# Patient Record
Sex: Male | Born: 1955 | Race: White | Hispanic: No | Marital: Married | Smoking: Never smoker
Health system: Southern US, Community
[De-identification: ages and names within clinical notes are randomized; demographics above are authoritative.]

## PROBLEM LIST (undated history)

## (undated) DIAGNOSIS — M5126 Other intervertebral disc displacement, lumbar region: Secondary | ICD-10-CM

## (undated) DIAGNOSIS — M5136 Other intervertebral disc degeneration, lumbar region: Secondary | ICD-10-CM

## (undated) DIAGNOSIS — I8390 Asymptomatic varicose veins of unspecified lower extremity: Secondary | ICD-10-CM

## (undated) DIAGNOSIS — T7840XA Allergy, unspecified, initial encounter: Secondary | ICD-10-CM

## (undated) DIAGNOSIS — Z8619 Personal history of other infectious and parasitic diseases: Secondary | ICD-10-CM

## (undated) DIAGNOSIS — K5790 Diverticulosis of intestine, part unspecified, without perforation or abscess without bleeding: Secondary | ICD-10-CM

## (undated) DIAGNOSIS — J302 Other seasonal allergic rhinitis: Secondary | ICD-10-CM

## (undated) DIAGNOSIS — M51369 Other intervertebral disc degeneration, lumbar region without mention of lumbar back pain or lower extremity pain: Secondary | ICD-10-CM

## (undated) DIAGNOSIS — K635 Polyp of colon: Secondary | ICD-10-CM

## (undated) DIAGNOSIS — M1990 Unspecified osteoarthritis, unspecified site: Secondary | ICD-10-CM

## (undated) HISTORY — DX: Personal history of other infectious and parasitic diseases: Z86.19

## (undated) HISTORY — DX: Diverticulosis of intestine, part unspecified, without perforation or abscess without bleeding: K57.90

## (undated) HISTORY — DX: Unspecified osteoarthritis, unspecified site: M19.90

## (undated) HISTORY — DX: Allergy, unspecified, initial encounter: T78.40XA

## (undated) HISTORY — DX: Asymptomatic varicose veins of unspecified lower extremity: I83.90

## (undated) HISTORY — DX: Other intervertebral disc displacement, lumbar region: M51.26

## (undated) HISTORY — DX: Polyp of colon: K63.5

## (undated) HISTORY — DX: Other intervertebral disc degeneration, lumbar region without mention of lumbar back pain or lower extremity pain: M51.369

## (undated) HISTORY — DX: Other intervertebral disc degeneration, lumbar region: M51.36

---

## 1977-08-27 HISTORY — PX: HERNIA REPAIR: SHX51

## 2007-11-17 ENCOUNTER — Ambulatory Visit: Payer: Self-pay | Admitting: Unknown Physician Specialty

## 2015-08-28 DIAGNOSIS — K5790 Diverticulosis of intestine, part unspecified, without perforation or abscess without bleeding: Secondary | ICD-10-CM

## 2015-08-28 DIAGNOSIS — K635 Polyp of colon: Secondary | ICD-10-CM

## 2015-08-28 HISTORY — DX: Polyp of colon: K63.5

## 2015-08-28 HISTORY — DX: Diverticulosis of intestine, part unspecified, without perforation or abscess without bleeding: K57.90

## 2015-08-28 HISTORY — PX: COLONOSCOPY: SHX174

## 2015-10-06 ENCOUNTER — Ambulatory Visit (INDEPENDENT_AMBULATORY_CARE_PROVIDER_SITE_OTHER): Payer: Managed Care, Other (non HMO) | Admitting: Family Medicine

## 2015-10-06 ENCOUNTER — Other Ambulatory Visit (INDEPENDENT_AMBULATORY_CARE_PROVIDER_SITE_OTHER): Payer: Managed Care, Other (non HMO)

## 2015-10-06 ENCOUNTER — Ambulatory Visit (INDEPENDENT_AMBULATORY_CARE_PROVIDER_SITE_OTHER)
Admission: RE | Admit: 2015-10-06 | Discharge: 2015-10-06 | Disposition: A | Discharge status: Home / Self Care | Payer: Managed Care, Other (non HMO) | Source: Ambulatory Visit | Attending: Family Medicine | Admitting: Family Medicine

## 2015-10-06 VITALS — BP 120/80 | HR 81 | Wt 247.0 lb

## 2015-10-06 DIAGNOSIS — M25561 Pain in right knee: Secondary | ICD-10-CM

## 2015-10-06 DIAGNOSIS — M1711 Unilateral primary osteoarthritis, right knee: Secondary | ICD-10-CM | POA: Insufficient documentation

## 2015-10-06 DIAGNOSIS — M129 Arthropathy, unspecified: Secondary | ICD-10-CM | POA: Diagnosis not present

## 2015-10-06 MED ORDER — DICLOFENAC SODIUM 2 % TD SOLN: TRANSDERMAL | Status: DC

## 2015-10-06 NOTE — Assessment & Plan Note (Signed)
I believe the patient has more arthritis of the right knee. Using is mild to moderate. X-rays are pending. No significant bony abnormalities otherwise. Do not see anything other than some degenerative meniscal tearing. No significant swelling today. Patient has elected try conservative therapy. Given home exercises and work with Product/process development scientist. We discussed icing regimen. We discussed topical anti-inflammatories and perception was given. We discussed over-the-counter medications. Patient will try all these changes as well as possible shoe choices. Patient and will come back and see me again in 4 weeks. If having worsening symptoms we'll consider physical therapy as well as potentially injections.

## 2015-10-06 NOTE — Patient Instructions (Signed)
Great to see you  Xray downstairs today  Ice is your friend Ice 20 minutes 2 times daily. Usually after activity and before bed. Exercises 3 times a week.  pennsaid pinkie amount topically 2 times daily as needed.  Spenco orthotics "total support" online would be great  Good shoes with rigid bottom.  Jalene Mullet, Merrell or New balance greater then 700, some of these could be in boots as well.  Avoid direct contact on the knee when you can.  Vitamin D 2000 IU daily Turmeric 500mg  twice daily See me again in 4 weeks to make sure you are making progress.

## 2015-10-06 NOTE — Progress Notes (Signed)
[facility] [place], [name] [address] Phone: [phone] Subjective:    I'm seeing this patient by the request  of:  Angelyn Punt NP  CC: Right knee pain  [id number] Dylan Crawford Sr. is a 60 y.o. male coming in with complaint of right knee pain. States that his been going on years. Has gotten worse over the last multiple months. Son another provider and was sent here for further evaluation. Patient states the pain is more when he is going up and down stairs as well as when he puts pressure on the kneecap itself. Patient is a Dealer and notices quite frequently. States that certain range of motion can give him some uncomfortable feeling. Sometimes has a crepitus motion. Sometimes feel a little unstable. Never has given out on him. No nighttime awakening. No numbness or radiation of the pain. Rates the severity of pain a 6 out of 10. Has responded fairly well to ibuprofen. Patient is more concerned because it does seem to be worsening slowly.     No past medical history on file. seasonal allergies No past surgical history on file. Social History   Social History  . Marital Status: Married    Spouse Name: N/A  . Number of Children: N/A  . Years of Education: N/A   Social History Main Topics  . Smoking status: Not on file  . Smokeless tobacco: Not on file  . Alcohol Use: Not on file  . Drug Use: Not on file  . Sexual Activity: Not on file   Other Topics Concern  . Not on file   Social History Narrative  . No narrative on file   Not on File no known drug allergies No family history on file. no family history of autoimmune diseases.  Past medical history, social, surgical and family history all reviewed in electronic medical record.  No pertanent information unless stated regarding to the chief complaint.   Review of Systems: No headache, visual changes, nausea, vomiting, diarrhea, constipation, dizziness, abdominal pain, skin  rash, fevers, chills, night sweats, weight loss, swollen lymph nodes, body aches, joint swelling, muscle aches, chest pain, shortness of breath, mood changes.   Objective Blood pressure 120/80, pulse 81, weight 247 lb (112.038 kg), SpO2 97 %.  General: No apparent distress alert and oriented x3 mood and affect normal, dressed appropriately.  HEENT: Pupils equal, extraocular movements intact  Respiratory: Patient's speak in full sentences and does not appear short of breath  Cardiovascular: No lower extremity edema, non tender, no erythema  Skin: Warm dry intact with no signs of infection or rash on extremities or on axial skeleton.  Abdomen: Soft nontender  Neuro: Cranial nerves II through XII are intact, neurovascularly intact in all extremities with 2+ DTRs and 2+ pulses.  Lymph: No lymphadenopathy of posterior or anterior cervical chain or axillae bilaterally.  Gait normal with good balance and coordination.  MSK:  Non tender with full range of motion and good stability and symmetric strength and tone of shoulders, elbows, wrist, hip, and ankles bilaterally.  Knee: Right Normal to inspection with no erythema or effusion or obvious bony abnormalities. Mild tenderness to palpation over the medial joint line ROM full in flexion and extension and lower leg rotation. Ligaments with solid consistent endpoints including ACL, PCL, LCL, MCL. Negative Mcmurray's, Apley's, and Thessalonian tests. Mild painful patellar compression. Patellar glide with mild crepitus. Patellar and quadriceps tendons unremarkable. Hamstring and quadriceps strength is normal. Contralateral knee  unremarkable. MSK US performed of: Right knee This study was ordered, performed, and interpreted by Charlann Boxer D.O.  Knee: All structures visualized. Moderate degenerative changes of the medial and lateral meniscus with mild to moderate narrowing of both joint lines. Patellar Tendon unremarkable on long and transverse views  without effusion. No abnormality of prepatellar bursa. LCL and MCL unremarkable on long and transverse views. No abnormality of origin of medial or lateral head of the gastrocnemius. Images saved in internal hard drive  IMPRESSION:  Mild to moderate os threaded changes with degenerative changes of the meniscus.  Procedure note [id number]; 15 minutes spent for Therapeutic exercises as stated in above notes.  This included exercises focusing on stretching, strengthening, with significant focus on eccentric aspects.  Suction and extension exercises given working on the vastus medialis oblique as well as hamstring stretching. Patient also given information for hip abductor strengthening for more stabilization. Proper technique shown and discussed handout in great detail with ATC.  All questions were discussed and answered.     Impression and Recommendations:     This case required medical decision making of moderate complexity.      Note: This dictation was prepared with Dragon dictation along with smaller phrase technology. Any transcriptional errors that result from this process are unintentional.

## 2015-11-02 ENCOUNTER — Telehealth: Payer: Self-pay | Admitting: Family Medicine

## 2015-11-02 NOTE — Telephone Encounter (Signed)
OK to schedule for CPE and to re-establish.

## 2015-11-02 NOTE — Telephone Encounter (Signed)
Pt would like to re-establish and schedule a CPE with Dr. Caryn Section. Pt was last seen in 2011. Pt works with [employer] and was able to go to the clinic they offer for employees for minor issues. Pt stated he has not been to another PCP and wasn't aware that if he went without being seen in 3 + years he would no longer be a pt. Insurance : Garment/textile technologist. No new medications or medical conditions. Can we re-establish? Please advise. Thanks TNP

## 2015-11-02 NOTE — Telephone Encounter (Signed)
Please advise 

## 2015-11-03 ENCOUNTER — Encounter: Payer: Self-pay | Admitting: Family Medicine

## 2015-11-03 ENCOUNTER — Ambulatory Visit (INDEPENDENT_AMBULATORY_CARE_PROVIDER_SITE_OTHER): Payer: Managed Care, Other (non HMO) | Admitting: Family Medicine

## 2015-11-03 VITALS — BP 122/84 | HR 72 | Wt 241.0 lb

## 2015-11-03 DIAGNOSIS — M129 Arthropathy, unspecified: Secondary | ICD-10-CM | POA: Diagnosis not present

## 2015-11-03 DIAGNOSIS — M5416 Radiculopathy, lumbar region: Secondary | ICD-10-CM | POA: Diagnosis not present

## 2015-11-03 DIAGNOSIS — M1711 Unilateral primary osteoarthritis, right knee: Secondary | ICD-10-CM

## 2015-11-03 MED ORDER — PREDNISONE 50 MG PO TABS: 50.0000 mg | ORAL_TABLET | Freq: Every day | ORAL | Status: DC

## 2015-11-03 MED ORDER — GABAPENTIN 100 MG PO CAPS: 200.0000 mg | ORAL_CAPSULE | Freq: Every day | ORAL | Status: DC

## 2015-11-03 NOTE — Progress Notes (Signed)
[name] [address], [place] [address] Phone: [phone] Subjective:    I'm seeing this patient by the request  of:  Angelyn Punt NP  CC: Right knee painFollow-up  [id number] ROWEN BEICHLER Sr. is a 60 y.o. male coming in with complaint of right knee pain. Found to have moderate arthritis. Patient elected try conservative therapy. Patient has not been doing the topical anti-inflammatory's regularly. Did get some the vitamins. Not doing any exercises or icing regularly. Still states that he is approximately 30-40% better. Nothing that his stopping him from activities. States that the sharp stabbing pains is almost resolved.  Patient is complaining of new problem. Back pain. Right-sided. Sometimes has radiation down the leg. Seems to go sometimes to the groin area as well. Affecting some times when he walks. Raises severity pain is 6 out of 10. Has been going on for multiple years but seems to be worsening. An states that the radiation down the leg now seems to be more chronic. Sometimes very difficult to find a comfortable position at night. Affecting some of his daily activities. Denies any weakness.     No past medical history on file. seasonal allergies No past surgical history on file. Social History   Social History  . Marital Status: Married    Spouse Name: N/A  . Number of Children: N/A  . Years of Education: N/A   Social History Main Topics  . Smoking status: Never Smoker   . Smokeless tobacco: None  . Alcohol Use: None  . Drug Use: None  . Sexual Activity: Not Asked   Other Topics Concern  . None   Social History Narrative  . None   Not on File no known drug allergies No family history on file. no family history of autoimmune diseases.  Past medical history, social, surgical and family history all reviewed in electronic medical record.  No pertanent information unless stated regarding to the chief complaint.    Review of Systems: No headache, visual changes, nausea, vomiting, diarrhea, constipation, dizziness, abdominal pain, skin rash, fevers, chills, night sweats, weight loss, swollen lymph nodes, body aches, joint swelling, muscle aches, chest pain, shortness of breath, mood changes.   Objective Blood pressure 122/84, pulse 72, weight 241 lb (109.317 kg), SpO2 96 %.  General: No apparent distress alert and oriented x3 mood and affect normal, dressed appropriately.  HEENT: Pupils equal, extraocular movements intact  Respiratory: Patient's speak in full sentences and does not appear short of breath  Cardiovascular: No lower extremity edema, non tender, no erythema  Skin: Warm dry intact with no signs of infection or rash on extremities or on axial skeleton.  Abdomen: Soft nontender  Neuro: Cranial nerves II through XII are intact, neurovascularly intact in all extremities with 2+ DTRs and 2+ pulses.  Lymph: No lymphadenopathy of posterior or anterior cervical chain or axillae bilaterally.  Gait Mild antalgic gait.  MSK:  Non tender with full range of motion and good stability and symmetric strength and tone of shoulders, elbows, wrist, hip, and ankles bilaterally.  Knee: Right Normal to inspection with no erythema or effusion or obvious bony abnormalities. Mild tenderness to palpation over the medial joint line ROM full in flexion and extension and lower leg rotation. Ligaments with solid consistent endpoints including ACL, PCL, LCL, MCL. Negative Mcmurray's, Apley's, and Thessalonian tests. Mild painful patellar compression. Patellar glide with mild crepitus. Patellar and quadriceps tendons unremarkable. Hamstring and quadriceps strength is normal. Contralateral  knee unremarkable.  Back Exam:  Inspection: Unremarkable  Motion: Flexion 45 deg, Extension 35 deg, Side Bending to 45 deg bilaterally,  Rotation to 45 deg bilaterally  SLR laying: Positive right XSLR laying: Negative  Palpable  tenderness: Pain over the right and sacral iliac joint in the right paraspinal musculature FABER: Mild positive right. Sensory change: Gross sensation intact to all lumbar and sacral dermatomes.  Reflexes: 2+ at both patellar tendons, 2+ at achilles tendons, Babinski's downgoing.  Strength at foot  Plantar-flexion: 5/5 Dorsi-flexion: 5/5 Eversion: 5/5 Inversion: 5/5  Leg strength  Quad: 5/5 Hamstring: 5/5 Hip flexor: 5/5 Hip abductors: 4/5  Gait unremarkable.  Patient does have some decreased internal rotation of the hip as well.     Impression and Recommendations:     This case required medical decision making of moderate complexity.

## 2015-11-03 NOTE — Telephone Encounter (Signed)
Pt returned call. I scheduled appt for CPE on 11/08/15. Thanks TNP

## 2015-11-03 NOTE — Assessment & Plan Note (Signed)
Patient does have more of a lumbar radiculopathy. Patient has had this pain for quite some time but is have a positive straight leg test today. Patient was put on prednisone as well as gabapentin. Dylan Crawford any worsening symptoms to seek medical attention immediately. Home exercises given. We discussed icing regimen. Come back and see me again in 2 weeks for further evaluation. At that time imaging will be necessary if not better.

## 2015-11-03 NOTE — Patient Instructions (Addendum)
Good to see you  Concern more for your back.   Exercises 3 times a week.  Prednisone daily for next 5 days  Gabapentin 100mg  at night for first 3 nights then 200mg  nightly thereafter.  See me again in 2 weeks if we are not happy with the back or the knee For PCP look up [facility].

## 2015-11-03 NOTE — Assessment & Plan Note (Signed)
Discussed with patient again at great length. Encourage patient to potentially try injection of formal physical therapy which she declined both. We also discussed possible bracing which patient declined. Patient does have some mild his bodies noted on x-ray. Patient was to continue with conservative therapy. If any worsening symptoms he'll come back for further evaluation.

## 2015-11-03 NOTE — Telephone Encounter (Signed)
LMTCB. Thanks TNP °

## 2015-11-03 NOTE — Progress Notes (Signed)
Pre visit review using our clinic review tool, if applicable. No additional management support is needed unless otherwise documented below in the visit note. 

## 2015-11-04 DIAGNOSIS — G619 Inflammatory polyneuropathy, unspecified: Secondary | ICD-10-CM

## 2015-11-04 DIAGNOSIS — R002 Palpitations: Secondary | ICD-10-CM | POA: Insufficient documentation

## 2015-11-04 DIAGNOSIS — I8390 Asymptomatic varicose veins of unspecified lower extremity: Secondary | ICD-10-CM | POA: Insufficient documentation

## 2015-11-04 DIAGNOSIS — G622 Polyneuropathy due to other toxic agents: Principal | ICD-10-CM

## 2015-11-08 ENCOUNTER — Ambulatory Visit (INDEPENDENT_AMBULATORY_CARE_PROVIDER_SITE_OTHER): Payer: Managed Care, Other (non HMO) | Admitting: Family Medicine

## 2015-11-08 ENCOUNTER — Encounter: Payer: Self-pay | Admitting: Family Medicine

## 2015-11-08 VITALS — BP 112/80 | HR 74 | Temp 98.1°F | Resp 16 | Ht 73.0 in | Wt 247.0 lb

## 2015-11-08 DIAGNOSIS — L301 Dyshidrosis [pompholyx]: Secondary | ICD-10-CM | POA: Diagnosis not present

## 2015-11-08 DIAGNOSIS — Z1211 Encounter for screening for malignant neoplasm of colon: Secondary | ICD-10-CM | POA: Diagnosis not present

## 2015-11-08 DIAGNOSIS — Z125 Encounter for screening for malignant neoplasm of prostate: Secondary | ICD-10-CM | POA: Diagnosis not present

## 2015-11-08 DIAGNOSIS — E669 Obesity, unspecified: Secondary | ICD-10-CM | POA: Diagnosis not present

## 2015-11-08 DIAGNOSIS — Z0000 Encounter for general adult medical examination without abnormal findings: Secondary | ICD-10-CM | POA: Diagnosis not present

## 2015-11-08 NOTE — Progress Notes (Signed)
Patient: Dylan RO Sr., Male    DOB: [birth year], 60 y.o.   MRN: [id number] Visit Date: 11/08/2015  Today's Provider: Lelon Huh, MD   Chief Complaint  Patient presents with  . Establish Care  . Annual Exam   Subjective:    Re- Establish Care: Last office visit was 12/01/2009. Patient states he works for the [employer] which they provide free clinic service for acute visits. Patient has not established with another PCP.    Annual physical exam Dylan CORIELL Sr. is a 60 y.o. male who presents today for health maintenance and complete physical. He feels well. He reports he does not have a regular exercise regimen. He reports he is sleeping fairly well.  -----------------------------------------------------------------   Review of Systems  Constitutional: Positive for fatigue. Negative for fever, chills and appetite change.  HENT: Positive for nosebleeds. Negative for congestion, ear pain, hearing loss and trouble swallowing.   Eyes: Negative for pain and visual disturbance.  Respiratory: Negative for cough, chest tightness and shortness of breath.   Cardiovascular: Negative for chest pain, palpitations and leg swelling.  Gastrointestinal: Negative for nausea, vomiting, abdominal pain, diarrhea, constipation and blood in stool.  Endocrine: Negative for polydipsia, polyphagia and polyuria.  Genitourinary: Negative for dysuria and flank pain.  Musculoskeletal: Positive for back pain, joint swelling, arthralgias and gait problem. Negative for myalgias and neck stiffness.  Skin: Negative for color change, rash and wound.  Allergic/Immunologic: Positive for environmental allergies.  Neurological: Negative for dizziness, tremors, seizures, speech difficulty, weakness, light-headedness and headaches.  Psychiatric/Behavioral: Negative for behavioral problems, confusion, sleep disturbance, dysphoric mood and decreased concentration. The patient is not nervous/anxious.   All  other systems reviewed and are negative.   Social History      He  reports that he has never smoked. He does not have any smokeless tobacco history on file. He reports that he does not drink alcohol or use illicit drugs.       Social History   Social History  . Marital Status: Married    Spouse Name: N/A  . Number of Children: 3  . Years of Education: N/A   Occupational History  . Mechanic    Social History Main Topics  . Smoking status: Never Smoker   . Smokeless tobacco: None  . Alcohol Use: 0.0 oz/week    0 Standard drinks or equivalent per week     Comment: minimal use  . Drug Use: No  . Sexual Activity: Not Asked   Other Topics Concern  . None   Social History Narrative    Past Medical History  Diagnosis Date  . History of chicken pox   . Neuropathy ([place])   . Varicose vein   . Allergy      Patient Active Problem List   Diagnosis Date Noted  . 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 Asymptomatic varicose veins of lower extremity 11/04/2015  . Palpitations 11/04/2015  . Right lumbar radiculopathy 11/03/2015  . Arthritis of right knee 10/06/2015    Past Surgical History  Procedure Laterality Date  . Hernia repair Right 1979    Inguinal hernia repair    Family History        Family Status  Relation Status Death Age  . Other Alive   . Mother Deceased     Respiratory problems  . Father Alive         His family history includes Stroke in his other. There is  no history of Cancer or Heart disease.    No Known Allergies  Previous Medications   DICLOFENAC SODIUM 2 % SOLN    Apply to affected area twice daily   FEXOFENADINE (ALLEGRA) 180 MG TABLET    Take 180 mg by mouth daily.   GABAPENTIN (NEURONTIN) 100 MG CAPSULE    Take 2 capsules (200 mg total) by mouth at bedtime.    Patient Care Team: Birdie Sons, MD as PCP - General (Family Medicine) Hulan Saas, MD as Resident     Objective:   Vitals: BP 112/80 mmHg  Pulse 74   Temp(Src) 98.1 F (36.7 C) (Oral)  Resp 16  Ht 6\' 1"  (1.854 m)  Wt 247 lb (112.038 kg)  BMI 32.59 kg/m2  SpO2 97%   Physical Exam   General Appearance:    Alert, cooperative, no distress, appears stated age, obese  Head:    Normocephalic, without obvious abnormality, atraumatic  Eyes:    PERRL, conjunctiva/corneas clear, EOM's intact, fundi    benign, both eyes       Ears:    Normal TM's and external ear canals, both ears  Nose:   Nares normal, septum midline, mucosa normal, no drainage   or sinus tenderness  Throat:   Lips, mucosa, and tongue normal; teeth and gums normal  Neck:   Supple, symmetrical, trachea midline, no adenopathy;       thyroid:  No enlargement/tenderness/nodules; no carotid   bruit or JVD  Back:     Symmetric, no curvature, ROM normal, no CVA tenderness  Lungs:     Clear to auscultation bilaterally, respirations unlabored  Chest wall:    No tenderness or deformity  Heart:    Regular rate and rhythm, S1 and S2 normal, no murmur, rub   or gallop  Abdomen:     Soft, non-tender, bowel sounds active all four quadrants,    no masses, no organomegaly  Genitalia:    deferred  Rectal:    deferred  Extremities:   Extremities normal, atraumatic, no cyanosis or edema  Pulses:   2+ and symmetric all extremities  Skin:   Skin color, texture, turgor normal, no rashes or lesions  Lymph nodes:   Cervical, supraclavicular, and axillary nodes normal  Neurologic:   CNII-XII intact. Normal strength, sensation and reflexes      throughout     Depression Screen PHQ 2/9 Scores 11/08/2015  PHQ - 2 Score 0  PHQ- 9 Score 0      Assessment & Plan:     Routine Health Maintenance and Physical Exam  Exercise Activities and Dietary recommendations Goals    None      Immunization History  Administered Date(s) Administered  . Hepatitis B 05/16/2000, 06/13/2000, 11/14/2000  . Td 03/10/1998  . Tdap 10/23/2010    Health Maintenance  Topic Date Due  . Hepatitis C  Screening  [birth year]  . HIV Screening  02/27/1972  . TETANUS/TDAP  02/27/1976  . COLONOSCOPY  02/27/2007  . INFLUENZA VACCINE  03/27/2016 (Originally 03/28/2015)      Discussed health benefits of physical activity, and encouraged him to engage in regular exercise appropriate for his age and condition.    --------------------------------------------------------------------  1. Annual physical exam Generally doing well. He states he gives blood every six wakes and his iron level is sometimes low. He does have history of iron deficiency but I suspect this is secondary to frequent blood donations.  - Lipid panel - Comprehensive metabolic panel - CBC -  Hepatitis C antibody - EKG 12-Lead  2. Colon cancer screening Has never had - Ambulatory referral to Gastroenterology  3. Prostate cancer screening PSA  4. Dyshidrotic eczema Is using prescriptions steroid cream and Allegra which he states has been very effective.   5. Obesity Counseled of ideal BMI and weight and he is going to start exercising more frequently as he back and knees improve.

## 2015-11-12 LAB — COMPREHENSIVE METABOLIC PANEL
ALK PHOS: 88 IU/L (ref 39–117)
ALT: 22 IU/L (ref 0–44)
AST: 19 IU/L (ref 0–40)
Albumin/Globulin Ratio: 1.3 (ref 1.2–2.2)
Albumin: 3.9 g/dL (ref 3.5–5.5)
BILIRUBIN TOTAL: 0.2 mg/dL (ref 0.0–1.2)
BUN / CREAT RATIO: 16 (ref 9–20)
BUN: 13 mg/dL (ref 6–24)
CHLORIDE: 99 mmol/L (ref 96–106)
CO2: 23 mmol/L (ref 18–29)
Calcium: 8.9 mg/dL (ref 8.7–10.2)
Creatinine, Ser: 0.79 mg/dL (ref 0.76–1.27)
GFR calc Af Amer: 114 mL/min/{1.73_m2} (ref >59)
GFR calc non Af Amer: 99 mL/min/{1.73_m2} (ref >59)
GLUCOSE: 85 mg/dL (ref 65–99)
Globulin, Total: 3.1 g/dL (ref 1.5–4.5)
Potassium: 4.8 mmol/L (ref 3.5–5.2)
Sodium: 138 mmol/L (ref 134–144)
Total Protein: 7 g/dL (ref 6.0–8.5)

## 2015-11-12 LAB — CBC
HEMATOCRIT: 42.6 % (ref 37.5–51.0)
HEMOGLOBIN: 14.3 g/dL (ref 12.6–17.7)
MCH: 27.4 pg (ref 26.6–33.0)
MCHC: 33.6 g/dL (ref 31.5–35.7)
MCV: 82 fL (ref 79–97)
Platelets: 267 10*3/uL (ref 150–379)
RBC: 5.21 x10E6/uL (ref 4.14–5.80)
RDW: 15.7 % — AB (ref 12.3–15.4)
WBC: 5.6 10*3/uL (ref 3.4–10.8)

## 2015-11-12 LAB — LIPID PANEL
CHOL/HDL RATIO: 3.7 ratio (ref 0.0–5.0)
Cholesterol, Total: 165 mg/dL (ref 100–199)
HDL: 45 mg/dL (ref >39)
LDL CALC: 101 mg/dL — AB (ref 0–99)
Triglycerides: 95 mg/dL (ref 0–149)
VLDL CHOLESTEROL CAL: 19 mg/dL (ref 5–40)

## 2015-11-12 LAB — HEPATITIS C ANTIBODY: Hep C Virus Ab: <0.1 s/co ratio (ref 0.0–0.9)

## 2015-11-12 LAB — PSA: PROSTATE SPECIFIC AG, SERUM: 0.4 ng/mL (ref 0.0–4.0)

## 2015-11-14 ENCOUNTER — Telehealth: Payer: Self-pay | Admitting: Gastroenterology

## 2015-11-14 NOTE — Telephone Encounter (Signed)
Patient called back and wants you to call him today to schedule his colonoscopy

## 2015-11-14 NOTE — Telephone Encounter (Signed)
Patient returning your call regarding colonoscopy °

## 2015-11-15 ENCOUNTER — Encounter: Payer: Self-pay | Admitting: Family Medicine

## 2015-11-17 ENCOUNTER — Ambulatory Visit (INDEPENDENT_AMBULATORY_CARE_PROVIDER_SITE_OTHER)
Admission: RE | Admit: 2015-11-17 | Discharge: 2015-11-17 | Disposition: A | Discharge status: Home / Self Care | Payer: Managed Care, Other (non HMO) | Source: Ambulatory Visit | Attending: Family Medicine | Admitting: Family Medicine

## 2015-11-17 ENCOUNTER — Encounter: Payer: Self-pay | Admitting: Family Medicine

## 2015-11-17 ENCOUNTER — Ambulatory Visit (INDEPENDENT_AMBULATORY_CARE_PROVIDER_SITE_OTHER): Payer: Managed Care, Other (non HMO) | Admitting: Family Medicine

## 2015-11-17 VITALS — BP 130/82 | HR 67 | Ht 73.0 in | Wt 245.0 lb

## 2015-11-17 DIAGNOSIS — M5416 Radiculopathy, lumbar region: Secondary | ICD-10-CM

## 2015-11-17 DIAGNOSIS — M129 Arthropathy, unspecified: Secondary | ICD-10-CM

## 2015-11-17 DIAGNOSIS — M1711 Unilateral primary osteoarthritis, right knee: Secondary | ICD-10-CM

## 2015-11-17 NOTE — Progress Notes (Signed)
Pre visit review using our clinic review tool, if applicable. No additional management support is needed unless otherwise documented below in the visit note. 

## 2015-11-17 NOTE — Patient Instructions (Signed)
Good to see you  Xray downstairs today  Physical therapy will be calling you and you decide.  Ice is still your friend Continue the exercises For the knee continue your brace Did injection today that should help See me again in 4 weeks.

## 2015-11-17 NOTE — Assessment & Plan Note (Signed)
Improving at this time. X-rays ordered for further evaluation. Referred to formal physical therapy to start increasing range of motion and core strengthening. We discussed what activities potentially avoid. Patient knows if any radicular symptoms occurs to seek medical attention medially. Patient come back and see me again in 4 weeks.

## 2015-11-17 NOTE — Progress Notes (Signed)
[name] [address], [place] [address] Phone: [phone] Subjective:    I'm seeing this patient by the request  of:  Angelyn Punt NP  CC: Right knee painFollow-up  [id number] Dylan ZHANG Sr. is a 60 y.o. male coming in with complaint of right knee pain. Found to have moderate arthritis.   Patient continues to have discomfort especially when he puts pressure on the knee. Sometimes sitting and going to a standing position can cause a sharp severe pain but does not give out on him. Finding it difficult to do some daily activities.  Patient is complaining of new problem. Back pain. Right-sided. Sometimes has radiation down the leg. Patient was given prednisone as well as gabapentin. States that the radiation of the pain down the leg has resolved but continues to have discomfort. Seems that more position seems to be causing her. Does sit down the long amount of time in a flexed position at work. States that when he goes to straighten up a can be severe. Seems to feel better if he lays down completely sometimes. Denies any weakness of the lower extremities. Did get significantly better with the prednisone.     Past Medical History  Diagnosis Date  . History of chicken pox   . Varicose vein   . Allergy    seasonal allergies Past Surgical History  Procedure Laterality Date  . Hernia repair Right 1979    Inguinal hernia repair   Social History   Social History  . Marital Status: Married    Spouse Name: N/A  . Number of Children: 3  . Years of Education: N/A   Occupational History  . Mechanic    Social History Main Topics  . Smoking status: Never Smoker   . Smokeless tobacco: Not on file  . Alcohol Use: 0.0 oz/week    0 Standard drinks or equivalent per week     Comment: minimal use  . Drug Use: No  . Sexual Activity: Not on file   Other Topics Concern  . Not on file   Social History Narrative   No Known Allergies no  known drug allergies Family History  Problem Relation Age of Onset  . Cancer Neg Hx   . Heart disease Neg Hx   . Stroke Other    no family history of autoimmune diseases.  Past medical history, social, surgical and family history all reviewed in electronic medical record.  No pertanent information unless stated regarding to the chief complaint.   Review of Systems: No headache, visual changes, nausea, vomiting, diarrhea, constipation, dizziness, abdominal pain, skin rash, fevers, chills, night sweats, weight loss, swollen lymph nodes, body aches, joint swelling, muscle aches, chest pain, shortness of breath, mood changes.   Objective Blood pressure 130/82, pulse 67, weight 245 lb (111.131 kg), SpO2 96 %.  General: No apparent distress alert and oriented x3 mood and affect normal, dressed appropriately.  HEENT: Pupils equal, extraocular movements intact  Respiratory: Patient's speak in full sentences and does not appear short of breath  Cardiovascular: No lower extremity edema, non tender, no erythema  Skin: Warm dry intact with no signs of infection or rash on extremities or on axial skeleton.  Abdomen: Soft nontender  Neuro: Cranial nerves II through XII are intact, neurovascularly intact in all extremities with 2+ DTRs and 2+ pulses.  Lymph: No lymphadenopathy of posterior or anterior cervical chain or axillae bilaterally.  Gait Mild antalgic gait.  MSK:  Non  tender with full range of motion and good stability and symmetric strength and tone of shoulders, elbows, wrist, hip, and ankles bilaterally.  Knee: Right Normal to inspection with no erythema or effusion or obvious bony abnormalities. Mild tenderness to palpation over the medial joint line ROM full in flexion and extension and lower leg rotation. Ligaments with solid consistent endpoints including ACL, PCL, LCL, MCL. Negative Mcmurray's, Apley's, and Thessalonian tests. Mild painful patellar compression. Patellar glide with mild  crepitus. Patellar and quadriceps tendons unremarkable. Hamstring and quadriceps strength is normal.  Contralateral knee unremarkable. No significant change from previous exam  Back Exam:  Inspection: Unremarkable  Motion: Flexion 45 deg, Extension 35 deg, Side Bending to 45 deg bilaterally,  Rotation to 45 deg bilaterally  SLR laying:negative XSLR laying: Negative  Palpable tenderness: continued discomfort over the right paraspinal musculaturee FABER: Mild positive right. Sensory change: Gross sensation intact to all lumbar and sacral dermatomes.  Reflexes: 2+ at both patellar tendons, 2+ at achilles tendons, Babinski's downgoing.  Strength at foot  Plantar-flexion: 5/5 Dorsi-flexion: 5/5 Eversion: 5/5 Inversion: 5/5  Leg strength  Quad: 5/5 Hamstring: 5/5 Hip flexor: 5/5 Hip abductors: 4/5  Gait unremarkable. Improvement from previous exam  After informed written and verbal consent, patient was seated on exam table. Right knee was prepped with alcohol swab and utilizing anterolateral approach, patient's right knee space was injected with 4:1  marcaine 0.5%: Kenalog 40mg /dL. Patient tolerated the procedure well without immediate complications.   Impression and Recommendations:     This case required medical decision making of moderate complexity.

## 2015-11-17 NOTE — Assessment & Plan Note (Signed)
Patient was given an injection today and tolerated the procedure well. Patient was given the choice for possible brace which patient declined. We did discuss that he could be a candidate for viscous supplementation. We discussed if any worsening symptoms also because of the loose bodies that have been noted on x-ray possible surgical intervention advance imaging could be warranted. Patient will follow-up again in 4 weeks.

## 2015-11-18 NOTE — Telephone Encounter (Signed)
LVM for pt to return my call.

## 2015-12-13 NOTE — Telephone Encounter (Signed)
LVM for pt to return my call.

## 2015-12-15 ENCOUNTER — Ambulatory Visit (INDEPENDENT_AMBULATORY_CARE_PROVIDER_SITE_OTHER): Payer: Managed Care, Other (non HMO) | Admitting: Family Medicine

## 2015-12-15 ENCOUNTER — Encounter: Payer: Self-pay | Admitting: Family Medicine

## 2015-12-15 VITALS — BP 122/82 | HR 64 | Ht 73.0 in | Wt 245.0 lb

## 2015-12-15 DIAGNOSIS — M129 Arthropathy, unspecified: Secondary | ICD-10-CM

## 2015-12-15 DIAGNOSIS — M5416 Radiculopathy, lumbar region: Secondary | ICD-10-CM

## 2015-12-15 DIAGNOSIS — M1711 Unilateral primary osteoarthritis, right knee: Secondary | ICD-10-CM

## 2015-12-15 NOTE — Assessment & Plan Note (Signed)
No longer having any radicular symptoms at this time. Seems to be doing better. We'll advance 17 is doing at work. Patient given a letter though to see if any ergonomics completely rule. Patient will continue with the therapist. Discuss core strengthening and avoiding certain significant twisting motions. Any worsening symptoms come back sooner otherwise follow-up in 2 months.  Spent  25 minutes with patient face-to-face and had greater than 50% of counseling including as described above in assessment and plan.

## 2015-12-15 NOTE — Patient Instructions (Signed)
Good to see you  Ice 20 minutes 2 times daily. Usually after activity and before bed. Keep doing what you are doing Can use the pennsaid on the back  Use the letter if you need it Make an appointment in 2 months and if the knee is worse we can repeat the injection.  Otherwise see me when you need me.

## 2015-12-15 NOTE — Progress Notes (Signed)
[facility] [place] [place], [place] [address] Phone: [phone] Subjective:    I'm seeing this patient by the request  of:  Dylan Punt NP  CC: Right knee painFollow-up  [id number] PIERS [name] is a 60 y.o. male coming in with complaint of right knee pain. Found to have moderate arthritis.   Patient's was given an injection at last follow-up and states that he is feeling 80-85% better. Patient states that he still avoiding certain activities at work but overall seems to be making it improved. Patient states the locking. Sometimes mild discomfort and swelling at the end of a long day but nothing that is concerning to him.  Patient was also seen for back pain. Patient did have x-rays showing moderate arthritic changes and degenerative disc disease at multiple levels. Patient is working with a physical therapist and is making progress. Feels like it is overall doing well. No radicular symptoms down the leg, no numbness noted tingling. States that it seems to be more localized to the back. Concerned though because he is also not doing all the activities at work at this time.     Past Medical History  Diagnosis Date  . History of chicken pox   . Varicose vein   . Allergy    seasonal allergies Past Surgical History  Procedure Laterality Date  . Hernia repair Right 1979    Inguinal hernia repair   Social History   Social History  . Marital Status: Married    Spouse Name: N/A  . Number of Children: 3  . Years of Education: N/A   Occupational History  . Mechanic    Social History Main Topics  . Smoking status: Never Smoker   . Smokeless tobacco: None  . Alcohol Use: 0.0 oz/week    0 Standard drinks or equivalent per week     Comment: minimal use  . Drug Use: No  . Sexual Activity: Not Asked   Other Topics Concern  . None   Social History Narrative   No Known Allergies no known drug allergies Family History  Problem  Relation Age of Onset  . Cancer Neg Hx   . Heart disease Neg Hx   . Stroke Other    no family history of autoimmune diseases.  Past medical history, social, surgical and family history all reviewed in electronic medical record.  No pertanent information unless stated regarding to the chief complaint.   Review of Systems: No headache, visual changes, nausea, vomiting, diarrhea, constipation, dizziness, abdominal pain, skin rash, fevers, chills, night sweats, weight loss, swollen lymph nodes, body aches, joint swelling, muscle aches, chest pain, shortness of breath, mood changes.   Objective Blood pressure 122/82, pulse 64, height 6\' 1"  (1.854 m), weight 245 lb (111.131 kg), SpO2 94 %.  General: No apparent distress alert and oriented x3 mood and affect normal, dressed appropriately.  HEENT: Pupils equal, extraocular movements intact  Respiratory: Patient's speak in full sentences and does not appear short of breath  Cardiovascular: No lower extremity edema, non tender, no erythema  Skin: Warm dry intact with no signs of infection or rash on extremities or on axial skeleton.  Abdomen: Soft nontender  Neuro: Cranial nerves II through XII are intact, neurovascularly intact in all extremities with 2+ DTRs and 2+ pulses.  Lymph: No lymphadenopathy of posterior or anterior cervical chain or axillae bilaterally.  Gait Mild antalgic gait.  MSK:  Non tender with full range of motion and  good stability and symmetric strength and tone of shoulders, elbows, wrist, hip, and ankles bilaterally.  Knee: Right Normal to inspection with no erythema or effusion or obvious bony abnormalities. nontender on exam today ROM full in flexion and extension and lower leg rotation. Ligaments with solid consistent endpoints including ACL, PCL, LCL, MCL. Negative Mcmurray's, Apley's, and Thessalonian tests. minimalpainful patellar compression. Patellar glide with mild crepitus. Patellar and quadriceps tendons  unremarkable. Hamstring and quadriceps strength is normal.  Contralateral knee unremarkable. Improvement from previous exam  Back Exam:  Inspection: Unremarkable  Motion: Flexion 45 deg, Extension 35 deg, Side Bending to 45 deg bilaterally,  Rotation to 45 deg bilaterally  SLR laying:negative XSLR laying: Negative  Palpable tenderness: continued discomfort over the right paraspinal musculaturee FABER: negative today Sensory change: Gross sensation intact to all lumbar and sacral dermatomes.  Reflexes: 2+ at both patellar tendons, 2+ at achilles tendons, Babinski's downgoing.  Strength at foot  Plantar-flexion: 5/5 Dorsi-flexion: 5/5 Eversion: 5/5 Inversion: 5/5  Leg strength  Quad: 5/5 Hamstring: 5/5 Hip flexor: 5/5 Hip abductors: 4/5  Gait unremarkable. Improvement from previous exam     Impression and Recommendations:     This case required medical decision making of moderate complexity.

## 2015-12-15 NOTE — Assessment & Plan Note (Signed)
Patient is doing significantly better after the injection. Discuss the possibility because of the moderate arthritis and loose bodies that advance imaging to be warranted because he could be a candidate for an arthroscopic procedure. Vision also is a candidate for viscous supplementation if he would like to try this at any point. Patient feels that he would like to continue with the conservative therapy. Patient was given a letter for ergonomics. Has topical anti-inflammatories. Will follow-up in 2 months for further evaluation. If worsening symptoms we'll consider repeating injection.

## 2015-12-15 NOTE — Progress Notes (Signed)
Pre visit review using our clinic review tool, if applicable. No additional management support is needed unless otherwise documented below in the visit note. 

## 2015-12-20 NOTE — Telephone Encounter (Signed)
Mailed letter requesting a return call.

## 2016-01-10 LAB — HM COLONOSCOPY

## 2016-02-10 DIAGNOSIS — Z86010 Personal history of colonic polyps: Secondary | ICD-10-CM | POA: Insufficient documentation

## 2016-02-16 ENCOUNTER — Ambulatory Visit: Payer: Managed Care, Other (non HMO) | Admitting: Family Medicine

## 2016-02-24 ENCOUNTER — Ambulatory Visit: Payer: Self-pay | Admitting: Physician Assistant

## 2016-02-24 ENCOUNTER — Encounter: Payer: Self-pay | Admitting: Physician Assistant

## 2016-02-24 VITALS — BP 130/92 | HR 80 | Temp 98.6°F

## 2016-02-24 DIAGNOSIS — R0689 Other abnormalities of breathing: Secondary | ICD-10-CM

## 2016-02-24 DIAGNOSIS — Z9109 Other allergy status, other than to drugs and biological substances: Secondary | ICD-10-CM

## 2016-02-24 MED ORDER — ALBUTEROL SULFATE HFA 108 (90 BASE) MCG/ACT IN AERS: 2.0000 | INHALATION_SPRAY | Freq: Four times a day (QID) | RESPIRATORY_TRACT | Status: DC | PRN

## 2016-02-24 MED ORDER — MONTELUKAST SODIUM 10 MG PO TABS: 10.0000 mg | ORAL_TABLET | Freq: Every day | ORAL | Status: DC

## 2016-02-24 NOTE — Progress Notes (Signed)
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

## 2016-02-24 NOTE — Addendum Note (Signed)
Addended by: Rudene Anda T on: 02/24/2016 02:47 PM   Modules accepted: Orders

## 2016-02-27 LAB — ALLERGEN PROFILE, BASIC FOOD
Allergen Corn, IgE: <0.1 kU/L
BEEF IGE: 0.65 kU/L — AB
FOOD MIX (SEAFOODS) IGE: NEGATIVE
Peanut IgE: 0.27 kU/L — AB
Pork IgE: 0.28 kU/L — AB
Wheat IgE: 0.23 kU/L — AB

## 2016-02-27 LAB — ALLERGEN PROFILE, MINI-PANEL
Alternaria Alternata IgE: 0.16 kU/L — AB
Cat Dander IgE: 1.58 kU/L — AB
D001-IGE D PTERONYSSINUS: 2.83 kU/L — AB
D002-IGE D FARINAE: 4.1 kU/L — AB
E005-IGE DOG DANDER: 0.38 kU/L — AB
G002-IGE BERMUDA GRASS: 7.24 kU/L — AB
G008-IGE BLUEGRASS, KENTUCK: 15.9 kU/L — AB
Mouse Urine IgE: <0.1 kU/L
Oak, White IgE: 3.32 kU/L — AB
Plantain, English IgE: 1.93 kU/L — AB
T008-IGE ELM, AMERICAN: 4.47 kU/L — AB
W001-IGE RAGWEED, SHORT: 6.08 kU/L — AB

## 2016-02-27 MED ORDER — EPINEPHRINE 0.3 MG/0.3ML IJ SOAJ: 0.3000 mg | Freq: Once | INTRAMUSCULAR | Status: DC

## 2016-02-27 NOTE — Addendum Note (Signed)
Addended by: Versie Starks on: 02/27/2016 09:51 AM   Modules accepted: Orders

## 2016-02-27 NOTE — Progress Notes (Signed)
Discussed lab results with patient, due to his reactions while mowing and while at work, told him he should keep an epi pen on hand , if he has to use the epi pen he should go to the ER after injecting himself

## 2016-03-01 NOTE — Progress Notes (Signed)
Patient ID: Dylan Clinton Sr., male   DOB: [birth year], 60 y.o.   MRN: [id number] Patient has been scheduled to see Dr. Pryor Ochoa at [facility] on 03/19/16 at [place].  Patient has been notified.

## 2016-03-19 ENCOUNTER — Other Ambulatory Visit: Payer: Self-pay | Admitting: Physician Assistant

## 2016-03-19 DIAGNOSIS — J3089 Other allergic rhinitis: Secondary | ICD-10-CM

## 2016-03-19 NOTE — Progress Notes (Signed)
Patient came in to have blood drawn per Dr. Darien Ramus from [facility] orders.

## 2016-03-22 LAB — ALLERGEN PANEL (27) + IGE
Aspergillus Fumigatus IgE: <0.1 kU/L
Bermuda Grass IgE: 6.86 kU/L — AB
Cat Dander IgE: 1.31 kU/L — AB
Cedar, Mountain IgE: 10.9 kU/L — AB
Cockroach, American IgE: <0.1 kU/L
Common Silver Birch IgE: 4.88 kU/L — AB
D Pteronyssinus IgE: 3 kU/L — AB
D002-IGE D FARINAE: 3.26 kU/L — AB
Dog Dander IgE: 0.37 kU/L — AB
G008-IGE BLUEGRASS, KENTUCK: 15.9 kU/L — AB
G010-IGE JOHNSON GRASS: 2.59 kU/L — AB
G017-IGE BAHIA GRASS: 3.8 kU/L — AB
Hickory, White IgE: 8.43 kU/L — AB
IgE (Immunoglobulin E), Serum: 531 IU/mL — ABNORMAL HIGH (ref 0–100)
M006-IGE ALTERNARIA ALTERNATA: 0.14 kU/L — AB
Maple/Box Elder IgE: 1.4 kU/L — AB
Mucor Racemosus IgE: <0.1 kU/L
Penicillium Chrysogen IgE: <0.1 kU/L
Ragweed, Short IgE: 5.81 kU/L — AB
T007-IGE OAK, WHITE: 2.8 kU/L — AB
T008-IGE ELM, AMERICAN: 5.23 kU/L — AB
TIMOTHY IGE: 12.1 kU/L — AB
W009-IGE PLANTAIN, ENGLISH: 2.07 kU/L — AB
W013-IGE COCKLEBUR: 0.56 kU/L — AB
W014-IGE PIGWEED, ROUGH: 0.15 kU/L — AB

## 2016-03-22 LAB — ALPHA-GAL PANEL
Alpha Gal IgE*: 0.12 kU/L (ref <0.35)
BEEF (BOS SPP) IGE: 0.7 kU/L — AB (ref <0.35)
BEEF CLASS INTERPRETATION: 2
Class Interpretation: 2
Lamb/Mutton (Ovis spp) IgE: 0.73 kU/L — ABNORMAL HIGH (ref <0.35)
Pork (Sus spp) IgE: 0.27 kU/L (ref <0.35)

## 2016-03-22 LAB — F242-IGE BING CHERRY: F242-IGE BING CHERRY: 0.21 kU/L — AB

## 2016-03-22 LAB — ALLERGEN, APPLE F49: Allergen Apple, IgE: 0.96 kU/L — AB

## 2016-03-22 LAB — F328-IGE FIG: F328-IGE FIG: 0.27 kU/L — AB

## 2016-03-22 LAB — ALLERGEN PEACH F95: ALLERGEN PEACH F95: 0.52 kU/L — AB

## 2016-03-22 LAB — F255-IGE PLUM: F255-IgE Plum: <0.1 kU/L

## 2016-05-23 ENCOUNTER — Ambulatory Visit: Payer: Self-pay | Admitting: Physician Assistant

## 2016-05-23 ENCOUNTER — Encounter: Payer: Self-pay | Admitting: Physician Assistant

## 2016-05-23 VITALS — BP 122/80 | HR 73 | Temp 98.2°F

## 2016-05-23 DIAGNOSIS — J45909 Unspecified asthma, uncomplicated: Secondary | ICD-10-CM

## 2016-05-23 MED ORDER — PREDNISONE 10 MG (21) PO TBPK: 10.0000 mg | ORAL_TABLET | Freq: Every day | ORAL | 0 refills | Status: DC

## 2016-05-23 NOTE — Progress Notes (Signed)
S: c/o runny nose, congestion, watery eyes, some sinus pressure, sx for about a week, denies fever/chills/body aches, cough, cp/sob, or v/d, some wheezing  O: vitals wnl, nad, perrl eomi, conjunctiva wnl, tms dull, nasal mucosa swollen and boggy, throat wnl, neck supple no lymph, lungs c t a, cv rrr  A: acute seasonal allergies  P: saline nasal rinse, continue allergy meds , start using singulair again, sterapred ds 10mg  6 d dose pack

## 2016-07-03 ENCOUNTER — Encounter: Payer: Self-pay | Admitting: *Deleted

## 2016-07-03 ENCOUNTER — Ambulatory Visit: Payer: Self-pay | Admitting: Physician Assistant

## 2016-07-03 ENCOUNTER — Encounter: Payer: Self-pay | Admitting: Physician Assistant

## 2016-07-03 VITALS — BP 130/80 | HR 79 | Temp 98.9°F

## 2016-07-03 DIAGNOSIS — K429 Umbilical hernia without obstruction or gangrene: Secondary | ICD-10-CM

## 2016-07-03 DIAGNOSIS — M5440 Lumbago with sciatica, unspecified side: Secondary | ICD-10-CM

## 2016-07-03 DIAGNOSIS — R1031 Right lower quadrant pain: Secondary | ICD-10-CM

## 2016-07-03 MED ORDER — METHYLPREDNISOLONE 4 MG PO TBPK: ORAL_TABLET | ORAL | 0 refills | Status: DC

## 2016-07-03 MED ORDER — CYCLOBENZAPRINE HCL 10 MG PO TABS: 10.0000 mg | ORAL_TABLET | Freq: Three times a day (TID) | ORAL | 0 refills | Status: DC | PRN

## 2016-07-03 NOTE — Progress Notes (Signed)
S: c/o low back pain with radiation to r hip, states pain starts at lower lumbar and into sacrum, more pronounced when sleeping on r side, no numbness tingling in legs, no difficulty walking, no change in bowel or urinary habits, also having some abdominal pain, thinks he has a hernia at his belly button, some rlq pain, not sure if its pain from his back radiating around or ?another hernia, had inguinal hernia repair on r side when he was much younger, doesn't think they used mesh, no dif with bm, just hurts to strain due to back pain, no diarrhea  O: vitals wnl, nad, lungs c t a, cv rrr, spine nontender, tender at si joint on r, abd soft nontender, + umbilical hernia, able to reduce, rlq tenderness near pubic bone, no weakness noted, n/v intact  A: back pain, abdominal pain  P: medrol dose pack, flexeril for back pain, refer to surgeon for umbilical hernia and eval of rlq pain

## 2016-07-12 ENCOUNTER — Encounter: Payer: Self-pay | Admitting: General Surgery

## 2016-07-12 ENCOUNTER — Ambulatory Visit (INDEPENDENT_AMBULATORY_CARE_PROVIDER_SITE_OTHER): Payer: Managed Care, Other (non HMO) | Admitting: General Surgery

## 2016-07-12 VITALS — BP 128/78 | HR 86 | Resp 14 | Ht 73.0 in | Wt 242.0 lb

## 2016-07-12 DIAGNOSIS — K429 Umbilical hernia without obstruction or gangrene: Secondary | ICD-10-CM

## 2016-07-12 DIAGNOSIS — R1031 Right lower quadrant pain: Secondary | ICD-10-CM | POA: Diagnosis not present

## 2016-07-12 NOTE — Progress Notes (Signed)
Patient ID: Dylan Clinton Sr., male   DOB: 1956-03-27, 60 y.o.   MRN: [id number]  Chief Complaint  Patient presents with  . Hernia    HPI Dylan LINCOLN Sr. is a 60 y.o. male here today for an evaluation of a umbilical hernia.  States that he noticed it 2-3 months ago, especially when coughing and lifting.  It causes some abdominal pain especially when he coughs.  No nausea, vomiting, constipation or diarrhea noted. However, he states his lower groin area is painful and worse when he bends over, starting 2 weeks ago. He states he has to wear his pants unbuttoned because the pressure causes pain. He completed a course of oral steroids on Monday which relieved some of the groin pain as well as back pain from a bulging disc. He has been doing some physical therapy for his low back pain. I have reviewed the history of present illness with the patient.   HPI  Past Medical History:  Diagnosis Date  . Allergy    seasonal  . Arthritis   . Bulging lumbar disc   . Colon polyp 2017  . Diverticulosis 2017  . History of chicken pox   . Varicose vein     Past Surgical History:  Procedure Laterality Date  . COLONOSCOPY  2017  . HERNIA REPAIR Right 1979   Inguinal hernia repair    Family History  Problem Relation Age of Onset  . Stroke Other   . Cancer Neg Hx   . Heart disease Neg Hx     Social History Social History  Substance Use Topics  . Smoking status: Never Smoker  . Smokeless tobacco: Never Used  . Alcohol use 0.0 oz/week     Comment: minimal use    No Known Allergies  Current Outpatient Prescriptions  Medication Sig Dispense Refill  . albuterol (PROVENTIL HFA;VENTOLIN HFA) 108 (90 Base) MCG/ACT inhaler Inhale 2 puffs into the lungs every 6 (six) hours as needed for wheezing or shortness of breath. 1 Inhaler 3  . cyclobenzaprine (FLEXERIL) 10 MG tablet Take 1 tablet (10 mg total) by mouth 3 (three) times daily as needed for muscle spasms. 30 tablet 0  . Diclofenac Sodium  2 % SOLN Apply to affected area twice daily 1 Bottle 2  . EPINEPHrine 0.3 mg/0.3 mL IJ SOAJ injection Inject 0.3 mLs (0.3 mg total) into the muscle once. 2 Device 6  . fexofenadine (ALLEGRA) 180 MG tablet Take 180 mg by mouth daily.    [name] gabapentin (NEURONTIN) 100 MG capsule Take 2 capsules (200 mg total) by mouth at bedtime. 60 capsule 3  . montelukast (SINGULAIR) 10 MG tablet Take 1 tablet (10 mg total) by mouth at bedtime. 30 tablet 3   No current facility-administered medications for this visit.     Review of Systems Review of Systems  Constitutional: Negative.   Respiratory: Negative.   Cardiovascular: Negative.   Gastrointestinal: Negative for constipation, diarrhea and nausea.    Blood pressure 128/78, pulse 86, resp. rate 14, height _0  (1.854 m), weight 242 lb (109.8 kg).  Physical Exam Physical Exam  Constitutional: He is oriented to person, place, and time. He appears well-developed and well-nourished.  HENT:  Mouth/Throat: Oropharynx is clear and moist.  Eyes: Conjunctivae are normal. No scleral icterus.  Neck: Neck supple.  Cardiovascular: Normal rate, regular rhythm and normal heart sounds.   Pulmonary/Chest: Effort normal and breath sounds normal.  Abdominal: Soft. Normal appearance and bowel sounds are normal. A  hernia is present. Hernia confirmed negative in the right inguinal area and confirmed negative in the left inguinal area.  Small umbilical hernia.  Mild fullness and some tenderness in right inguinal area  Lymphadenopathy:    He has no cervical adenopathy.  Neurological: He is alert and oriented to person, place, and time.  Skin: Skin is warm and dry.  Psychiatric: His behavior is normal.    Data Reviewed  Progress notes.  Assessment    Small umbilical hernia without obstruction or gangrene. Right groin pain- no hernia noted on exam    Plan    Patient to be scheduled for CT abdomen/pelvis to further investigate the groin pain he is  experiencing.  Patient is agreeable to this Patient has been scheduled for a CT abdomen/pelvis with contrast at [place] for 07-18-16 at 4 pm (arrive 3:45 pm). Prep: NPO 4 hours prior and pick up prep kit. Patient verbalizes understanding.      This information has been scribed by Karie Fetch RN, BSN,BC.   [name],[name] 07/12/2016, 3:10 PM

## 2016-07-12 NOTE — Patient Instructions (Addendum)
Patient to be scheduled for CT abdomen/pelvis  The patient is aware to call back for any questions or concerns.

## 2016-07-18 ENCOUNTER — Ambulatory Visit
Admission: RE | Admit: 2016-07-18 | Discharge: 2016-07-18 | Disposition: A | Discharge status: Home / Self Care | Payer: Managed Care, Other (non HMO) | Source: Ambulatory Visit | Attending: General Surgery | Admitting: General Surgery

## 2016-07-18 ENCOUNTER — Ambulatory Visit: Payer: Managed Care, Other (non HMO)

## 2016-07-18 DIAGNOSIS — K4020 Bilateral inguinal hernia, without obstruction or gangrene, not specified as recurrent: Secondary | ICD-10-CM | POA: Diagnosis not present

## 2016-07-18 DIAGNOSIS — K5730 Diverticulosis of large intestine without perforation or abscess without bleeding: Secondary | ICD-10-CM | POA: Insufficient documentation

## 2016-07-18 DIAGNOSIS — K760 Fatty (change of) liver, not elsewhere classified: Secondary | ICD-10-CM | POA: Insufficient documentation

## 2016-07-18 DIAGNOSIS — R1031 Right lower quadrant pain: Secondary | ICD-10-CM

## 2016-07-18 DIAGNOSIS — K429 Umbilical hernia without obstruction or gangrene: Secondary | ICD-10-CM | POA: Insufficient documentation

## 2016-07-18 DIAGNOSIS — N429 Disorder of prostate, unspecified: Secondary | ICD-10-CM | POA: Insufficient documentation

## 2016-07-18 MED ORDER — IOPAMIDOL (ISOVUE-300) INJECTION 61%
100.0000 mL | Freq: Once | INTRAVENOUS | Status: AC | PRN
  Administered 2016-07-18: 100 mL via INTRAVENOUS

## 2016-07-24 ENCOUNTER — Ambulatory Visit (INDEPENDENT_AMBULATORY_CARE_PROVIDER_SITE_OTHER): Payer: Managed Care, Other (non HMO) | Admitting: General Surgery

## 2016-07-24 ENCOUNTER — Encounter: Payer: Self-pay | Admitting: General Surgery

## 2016-07-24 VITALS — BP 142/77 | HR 64 | Ht 73.0 in | Wt 244.0 lb

## 2016-07-24 DIAGNOSIS — R1031 Right lower quadrant pain: Secondary | ICD-10-CM | POA: Diagnosis not present

## 2016-07-24 MED ORDER — MELOXICAM 7.5 MG PO TABS: 7.5000 mg | ORAL_TABLET | Freq: Every day | ORAL | 0 refills | Status: DC

## 2016-07-24 NOTE — Progress Notes (Signed)
Patient ID: Dylan Clinton Sr., male   DOB: 02/12/1956, 60 y.o.   MRN: [id number]  Chief Complaint  Patient presents with  . Follow-up    HPI Dylan TOMME Sr. is a 60 y.o. male here today following up from a ct scan done on 07/18/16 evaluating his right groin pain.  I have reviewed the history of present illness with the patient.    HPI  Past Medical History:  Diagnosis Date  . Allergy    seasonal  . Arthritis   . Bulging lumbar disc   . Colon polyp 2017  . Diverticulosis 2017  . History of chicken pox   . Varicose vein     Past Surgical History:  Procedure Laterality Date  . COLONOSCOPY  2017  . HERNIA REPAIR Right 1979   Inguinal hernia repair    Family History  Problem Relation Age of Onset  . Stroke Other   . Cancer Neg Hx   . Heart disease Neg Hx     Social History Social History  Substance Use Topics  . Smoking status: Never Smoker  . Smokeless tobacco: Never Used  . Alcohol use 0.0 oz/week     Comment: minimal use    No Known Allergies  Current Outpatient Prescriptions  Medication Sig Dispense Refill  . albuterol (PROVENTIL HFA;VENTOLIN HFA) 108 (90 Base) MCG/ACT inhaler Inhale 2 puffs into the lungs every 6 (six) hours as needed for wheezing or shortness of breath. 1 Inhaler 3  . cyclobenzaprine (FLEXERIL) 10 MG tablet Take 1 tablet (10 mg total) by mouth 3 (three) times daily as needed for muscle spasms. 30 tablet 0  . Diclofenac Sodium 2 % SOLN Apply to affected area twice daily 1 Bottle 2  . EPINEPHrine 0.3 mg/0.3 mL IJ SOAJ injection Inject 0.3 mLs (0.3 mg total) into the muscle once. 2 Device 6  . fexofenadine (ALLEGRA) 180 MG tablet Take 180 mg by mouth daily.    [name] gabapentin (NEURONTIN) 100 MG capsule Take 2 capsules (200 mg total) by mouth at bedtime. 60 capsule 3  . montelukast (SINGULAIR) 10 MG tablet Take 1 tablet (10 mg total) by mouth at bedtime. 30 tablet 3  . meloxicam (MOBIC) 7.5 MG tablet Take 1 tablet (7.5 mg total) by mouth  daily. 30 tablet 0   No current facility-administered medications for this visit.     Review of Systems Review of Systems  Constitutional: Negative.   Respiratory: Negative.   Cardiovascular: Negative.     Blood pressure (!) 142/77, pulse 64, height 6\' 1"  (1.854 m), weight 244 lb (110.7 kg).  Physical Exam Physical Exam  Constitutional: He is oriented to person, place, and time. He appears well-developed and well-nourished.  Neurological: He is alert and oriented to person, place, and time.  Psychiatric: He has a normal mood and affect. His behavior is normal.    Data Reviewed CT scan and previous notes CT revealed no significant findings to account for right groin pain Two small fat-containing hernias were seen, but not large enough to contribute to his pain  Assessment    Right groin pain, may be musculoskeletal in origin    Plan    Advised use of anti-inflammatory medication, Mobic 7.5 mg daily for one month Return in one month for follow-up      This information has been scribed by Gaspar Cola CMA.  Harumi Yamin G 07/24/2016, 11:39 AM

## 2016-07-24 NOTE — Patient Instructions (Signed)
Return in one month for follow-up  

## 2016-08-20 ENCOUNTER — Other Ambulatory Visit: Payer: Self-pay | Admitting: General Surgery

## 2016-08-22 ENCOUNTER — Ambulatory Visit (INDEPENDENT_AMBULATORY_CARE_PROVIDER_SITE_OTHER): Payer: Managed Care, Other (non HMO) | Admitting: General Surgery

## 2016-08-22 ENCOUNTER — Encounter: Payer: Self-pay | Admitting: General Surgery

## 2016-08-22 VITALS — BP 134/82 | HR 78 | Resp 16 | Ht 73.0 in | Wt 247.0 lb

## 2016-08-22 DIAGNOSIS — R1031 Right lower quadrant pain: Secondary | ICD-10-CM

## 2016-08-22 DIAGNOSIS — K429 Umbilical hernia without obstruction or gangrene: Secondary | ICD-10-CM | POA: Diagnosis not present

## 2016-08-22 NOTE — Progress Notes (Signed)
Patient ID: Dylan Clinton Sr., male   DOB: 1955/10/20, 60 y.o.   MRN: [id number]  Chief Complaint  Patient presents with  . Follow-up    right groin pain     HPI [name] is a 60 y.o. male here today for his follow up right groin/lpwer abdominal pain which has improved.Dylan Crawford He is using Mobic  now as needed and not everyday. I have reviewed the history of present illness with the patient.   HPI  Past Medical History:  Diagnosis Date  . Allergy    seasonal  . Arthritis   . Bulging lumbar disc   . Colon polyp 2017  . Diverticulosis 2017  . History of chicken pox   . Varicose vein     Past Surgical History:  Procedure Laterality Date  . COLONOSCOPY  2017  . HERNIA REPAIR Right 1979   Inguinal hernia repair    Family History  Problem Relation Age of Onset  . Stroke Other   . Cancer Neg Hx   . Heart disease Neg Hx     Social History Social History  Substance Use Topics  . Smoking status: Never Smoker  . Smokeless tobacco: Never Used  . Alcohol use 0.0 oz/week     Comment: minimal use    No Known Allergies  Current Outpatient Prescriptions  Medication Sig Dispense Refill  . albuterol (PROVENTIL HFA;VENTOLIN HFA) 108 (90 Base) MCG/ACT inhaler Inhale 2 puffs into the lungs every 6 (six) hours as needed for wheezing or shortness of breath. 1 Inhaler 3  . cyclobenzaprine (FLEXERIL) 10 MG tablet Take 1 tablet (10 mg total) by mouth 3 (three) times daily as needed for muscle spasms. 30 tablet 0  . Diclofenac Sodium 2 % SOLN Apply to affected area twice daily 1 Bottle 2  . EPINEPHrine 0.3 mg/0.3 mL IJ SOAJ injection Inject 0.3 mLs (0.3 mg total) into the muscle once. 2 Device 6  . fexofenadine (ALLEGRA) 180 MG tablet Take 180 mg by mouth daily.    Dylan Crawford gabapentin (NEURONTIN) 100 MG capsule Take 2 capsules (200 mg total) by mouth at bedtime. 60 capsule 3  . meloxicam (MOBIC) 7.5 MG tablet Take 1 tablet (7.5 mg total) by mouth daily. (Patient taking differently: Take  7.5 mg by mouth as needed. ) 30 tablet 0  . montelukast (SINGULAIR) 10 MG tablet Take 1 tablet (10 mg total) by mouth at bedtime. 30 tablet 3   No current facility-administered medications for this visit.     Review of Systems Review of Systems  Constitutional: Negative.   Respiratory: Negative.   Cardiovascular: Negative.     Blood pressure 134/82, pulse 78, resp. rate 16, height 6\' 1"  (1.854 m), weight 247 lb (112 kg).  Physical Exam Physical Exam  Constitutional: He is oriented to person, place, and time. He appears well-developed and well-nourished.  HENT:  Mouth/Throat: Oropharynx is clear and moist.  Eyes: Conjunctivae are normal. No scleral icterus.  Neck: Neck supple.  Cardiovascular: Normal rate, regular rhythm and normal heart sounds.   Pulmonary/Chest: Effort normal and breath sounds normal.  Abdominal: Soft. Normal appearance and bowel sounds are normal. There is no tenderness. A hernia is present. Hernia confirmed negative in the right inguinal area and confirmed negative in the left inguinal area.  2 cm partially reducible umbilical hernia   Lymphadenopathy:    He has no cervical adenopathy.  Neurological: He is alert and oriented to person, place, and time.  Skin: Skin is warm  and dry.  Psychiatric: His behavior is normal.    Data Reviewed  Prior notes.  Assessment    2 cm umbilical hernia Groin pain appears to be more muscular, no groin hernia     Plan    Hernia precautions and incarceration were discussed with the patient. If they develop symptoms of an incarcerated hernia, they were encouraged to seek prompt medical attention.  I have recommended repair of the umbilical hernia with possibly using mesh on an outpatient basis in the near future. The risk of infection was reviewed. The role of prosthetic mesh to minimize the risk of recurrence was reviewed.     Patient's surgery has been scheduled for 09-11-16 at [facility]   This information has been  scribed by Karie Fetch RN, BSN,BC.   Emonie Espericueta G 08/22/2016, 10:45 AM

## 2016-08-22 NOTE — Patient Instructions (Addendum)
The patient is aware to call back for any questions or concerns.  Umbilical Hernia, Adult A hernia is a bulge of tissue that pushes through an opening between muscles. An umbilical hernia happens in the abdomen, near the belly button (umbilicus). The hernia may contain tissues from the small intestine, large intestine, or fatty tissue covering the intestines (omentum). Umbilical hernias in adults tend to get worse over time, and they require surgical treatment. There are several types of umbilical hernias. You may have:  A hernia located just above or below the umbilicus (indirect hernia). This is the most common type of umbilical hernia in adults.  A hernia that forms through an opening formed by the umbilicus (direct hernia).  A hernia that comes and goes (reducible hernia). A reducible hernia may be visible only when you strain, lift something heavy, or cough. This type of hernia can be pushed back into the abdomen (reduced).  A hernia that traps abdominal tissue inside the hernia (incarcerated hernia). This type of hernia cannot be reduced.  A hernia that cuts off blood flow to the tissues inside the hernia (strangulated hernia). The tissues can start to die if this happens. This type of hernia requires emergency treatment. What are the causes? An umbilical hernia happens when tissue inside the abdomen presses on a weak area of the abdominal muscles. What increases the risk? You may have a greater risk of this condition if you:  Are obese.  Have had several pregnancies.  Have a buildup of fluid inside your abdomen (ascites).  Have had surgery that weakens the abdominal muscles. What are the signs or symptoms? The main symptom of this condition is a painless bulge at or near the belly button. A reducible hernia may be visible only when you strain, lift something heavy, or cough. Other symptoms may include:  Dull pain.  A feeling of pressure. Symptoms of a strangulated hernia may  include:  Pain that gets increasingly worse.  Nausea and vomiting.  Pain when pressing on the hernia.  Skin over the hernia becoming red or purple.  Constipation.  Blood in the stool. How is this diagnosed? This condition may be diagnosed based on:  A physical exam. You may be asked to cough or strain while standing. These actions increase the pressure inside your abdomen and force the hernia through the opening in your muscles. Your health care provider may try to reduce the hernia by pressing on it.  Your symptoms and medical history. How is this treated? Surgery is the only treatment for an umbilical hernia. Surgery for a strangulated hernia is done as soon as possible. If you have a small hernia that is not incarcerated, you may need to lose weight before having surgery. Follow these instructions at home:  Lose weight, if told by your health care provider.  Do not try to push the hernia back in.  Watch your hernia for any changes in color or size. Tell your health care provider if any changes occur.  You may need to avoid activities that increase pressure on your hernia.  Do not lift anything that is heavier than 10 lb (4.5 kg) until your health care provider says that this is safe.  Take over-the-counter and prescription medicines only as told by your health care provider.  Keep all follow-up visits as told by your health care provider. This is important. Contact a health care provider if:  Your hernia gets larger.  Your hernia becomes painful. Get help right away if:  You develop sudden, severe pain near the area of your hernia.  You have pain as well as nausea or vomiting.  You have pain and the skin over your hernia changes color.  You develop a fever. This information is not intended to replace advice given to you by your health care provider. Make sure you discuss any questions you have with your health care provider. Document Released: 01/13/2016 Document  Revised: 04/15/2016 Document Reviewed: 01/13/2016 Elsevier Interactive Patient Education  2017 [employer].

## 2016-09-04 ENCOUNTER — Telehealth: Payer: Self-pay | Admitting: *Deleted

## 2016-09-04 ENCOUNTER — Encounter
Admission: RE | Admit: 2016-09-04 | Discharge: 2016-09-04 | Disposition: A | Discharge status: Home / Self Care | Payer: Managed Care, Other (non HMO) | Source: Ambulatory Visit | Attending: General Surgery | Admitting: General Surgery

## 2016-09-04 HISTORY — DX: Other seasonal allergic rhinitis: J30.2

## 2016-09-04 NOTE — Telephone Encounter (Signed)
FMLA papers completed. 

## 2016-09-04 NOTE — Patient Instructions (Signed)
  Your procedure is scheduled on: 09-11-16 Report to Same Day Surgery 2nd floor medical mall [facility] Entrance-take elevator on left to 2nd floor.  Check in with surgery information desk.) To find out your arrival time please call [phone] between 1PM - 3PM on 09-10-16  Remember: Instructions that are not followed completely may result in serious medical risk, up to and including death, or upon the discretion of your surgeon and anesthesiologist your surgery may need to be rescheduled.    _x___ 1. Do not eat food or drink liquids after midnight. No gum chewing or hard candies.     __x__ 2. No Alcohol for 24 hours before or after surgery.   __x__3. No Smoking for 24 prior to surgery.   ____  4. Bring all medications with you on the day of surgery if instructed.    __x__ 5. Notify your doctor if there is any change in your medical condition     (cold, fever, infections).     Do not wear jewelry, make-up, hairpins, clips or nail polish.  Do not wear lotions, powders, or perfumes. You may wear deodorant.  Do not shave 48 hours prior to surgery. Men may shave face and neck.  Do not bring valuables to the hospital.    [facility] is not responsible for any belongings or valuables.               Contacts, dentures or bridgework may not be worn into surgery.  Leave your suitcase in the car. After surgery it may be brought to your room.  For patients admitted to the hospital, discharge time is determined by your treatment team.   Patients discharged the day of surgery will not be allowed to drive home.  You will need someone to drive you home and stay with you the night of your procedure.    Please read over the following fact sheets that you were given:   [facility] Preparing for Surgery and or MRSA Information   ____ Take these medicines the morning of surgery with A SIP OF WATER:    1. NONE  2.  3.  4.  5.  6.  ____Fleets enema or Magnesium Citrate as directed.   ____  Use CHG Soap or sage wipes as directed on instruction sheet   __X__ Use inhalers on the day of surgery and bring to hospital day of surgery-BRING ALBUTEROL [place]  ____ Stop metformin 2 days prior to surgery    ____ Take 1/2 of usual insulin dose the night before surgery and none on the morning of           surgery.   ____ Stop Aspirin, Coumadin, Pllavix ,Eliquis, Effient, or Pradaxa  x__ Stop Anti-inflammatories such as Advil, Aleve, Ibuprofen, Motrin, Naproxen,          Naprosyn, Goodies powders or aspirin products NOW-Ok to take Tylenol.   ____ Stop supplements until after surgery.    ____ Bring C-Pap to the hospital.

## 2016-09-07 ENCOUNTER — Other Ambulatory Visit: Payer: Self-pay | Admitting: Family Medicine

## 2016-09-11 ENCOUNTER — Ambulatory Visit: Payer: Managed Care, Other (non HMO) | Admitting: Anesthesiology

## 2016-09-11 ENCOUNTER — Ambulatory Visit
Admission: RE | Admit: 2016-09-11 | Discharge: 2016-09-11 | Disposition: A | Discharge status: Home / Self Care | Payer: Managed Care, Other (non HMO) | Source: Ambulatory Visit | Attending: General Surgery | Admitting: General Surgery

## 2016-09-11 ENCOUNTER — Encounter
Admission: RE | Disposition: A | Discharge status: Home / Self Care | Payer: Self-pay | Source: Ambulatory Visit | Attending: General Surgery

## 2016-09-11 ENCOUNTER — Encounter: Payer: Self-pay | Admitting: Anesthesiology

## 2016-09-11 DIAGNOSIS — J302 Other seasonal allergic rhinitis: Secondary | ICD-10-CM | POA: Diagnosis not present

## 2016-09-11 DIAGNOSIS — K429 Umbilical hernia without obstruction or gangrene: Secondary | ICD-10-CM | POA: Diagnosis not present

## 2016-09-11 DIAGNOSIS — Z79899 Other long term (current) drug therapy: Secondary | ICD-10-CM | POA: Diagnosis not present

## 2016-09-11 HISTORY — PX: UMBILICAL HERNIA REPAIR: SHX196

## 2016-09-11 SURGERY — REPAIR, HERNIA, UMBILICAL, ADULT
Anesthesia: General | Wound class: Clean

## 2016-09-11 MED ORDER — PROPOFOL 10 MG/ML IV BOLUS
INTRAVENOUS | Status: AC
  Filled 2016-09-11: qty 20

## 2016-09-11 MED ORDER — BUPIVACAINE HCL (PF) 0.5 % IJ SOLN
INTRAMUSCULAR | Status: DC | PRN
  Administered 2016-09-11: 30 mL

## 2016-09-11 MED ORDER — LACTATED RINGERS IV SOLN
INTRAVENOUS | Status: DC
  Administered 2016-09-11 (×2): via INTRAVENOUS

## 2016-09-11 MED ORDER — SUCCINYLCHOLINE CHLORIDE 200 MG/10ML IV SOSY
PREFILLED_SYRINGE | INTRAVENOUS | Status: AC
  Filled 2016-09-11: qty 10

## 2016-09-11 MED ORDER — ACETAMINOPHEN 10 MG/ML IV SOLN
INTRAVENOUS | Status: AC
  Filled 2016-09-11: qty 100

## 2016-09-11 MED ORDER — ONDANSETRON HCL 4 MG/2ML IJ SOLN
INTRAMUSCULAR | Status: AC
  Filled 2016-09-11: qty 2

## 2016-09-11 MED ORDER — PROMETHAZINE HCL 25 MG/ML IJ SOLN: 6.2500 mg | INTRAMUSCULAR | Status: DC | PRN

## 2016-09-11 MED ORDER — FENTANYL CITRATE (PF) 100 MCG/2ML IJ SOLN
INTRAMUSCULAR | Status: DC
Start: 2016-09-11 — End: 2016-09-11
  Filled 2016-09-11: qty 2

## 2016-09-11 MED ORDER — MIDAZOLAM HCL 2 MG/2ML IJ SOLN
INTRAMUSCULAR | Status: AC
  Filled 2016-09-11: qty 2

## 2016-09-11 MED ORDER — OXYCODONE-ACETAMINOPHEN 5-325 MG PO TABS: 1.0000 | ORAL_TABLET | ORAL | 0 refills | Status: DC | PRN

## 2016-09-11 MED ORDER — FENTANYL CITRATE (PF) 100 MCG/2ML IJ SOLN
INTRAMUSCULAR | Status: AC
  Filled 2016-09-11: qty 2

## 2016-09-11 MED ORDER — FENTANYL CITRATE (PF) 250 MCG/5ML IJ SOLN
INTRAMUSCULAR | Status: AC
  Filled 2016-09-11: qty 5

## 2016-09-11 MED ORDER — FAMOTIDINE 20 MG PO TABS
ORAL_TABLET | ORAL | Status: AC
  Filled 2016-09-11: qty 1

## 2016-09-11 MED ORDER — ROCURONIUM BROMIDE 50 MG/5ML IV SOSY
PREFILLED_SYRINGE | INTRAVENOUS | Status: AC
  Filled 2016-09-11: qty 5

## 2016-09-11 MED ORDER — ACETAMINOPHEN 10 MG/ML IV SOLN
INTRAVENOUS | Status: DC | PRN
  Administered 2016-09-11: 1000 mg via INTRAVENOUS

## 2016-09-11 MED ORDER — ONDANSETRON HCL 4 MG/2ML IJ SOLN
INTRAMUSCULAR | Status: DC | PRN
  Administered 2016-09-11: 4 mg via INTRAVENOUS

## 2016-09-11 MED ORDER — CHLORHEXIDINE GLUCONATE CLOTH 2 % EX PADS: 6.0000 | MEDICATED_PAD | Freq: Once | CUTANEOUS | Status: DC

## 2016-09-11 MED ORDER — MIDAZOLAM HCL 2 MG/2ML IJ SOLN
INTRAMUSCULAR | Status: DC | PRN
  Administered 2016-09-11: 2 mg via INTRAVENOUS

## 2016-09-11 MED ORDER — FENTANYL CITRATE (PF) 100 MCG/2ML IJ SOLN
INTRAMUSCULAR | Status: DC | PRN
  Administered 2016-09-11: 50 ug via INTRAVENOUS

## 2016-09-11 MED ORDER — LIDOCAINE HCL (PF) 1 % IJ SOLN
INTRAMUSCULAR | Status: AC
  Filled 2016-09-11: qty 30

## 2016-09-11 MED ORDER — LIDOCAINE HCL (CARDIAC) 20 MG/ML IV SOLN
INTRAVENOUS | Status: DC | PRN
  Administered 2016-09-11: 100 mg via INTRAVENOUS

## 2016-09-11 MED ORDER — BUPIVACAINE HCL (PF) 0.5 % IJ SOLN
INTRAMUSCULAR | Status: AC
  Filled 2016-09-11: qty 30

## 2016-09-11 MED ORDER — CEFAZOLIN SODIUM-DEXTROSE 2-4 GM/100ML-% IV SOLN
2.0000 g | INTRAVENOUS | Status: AC
  Administered 2016-09-11: 2 g via INTRAVENOUS

## 2016-09-11 MED ORDER — PROPOFOL 10 MG/ML IV BOLUS
INTRAVENOUS | Status: DC | PRN
  Administered 2016-09-11: 160 mg via INTRAVENOUS
  Administered 2016-09-11: 100 mg via INTRAVENOUS
  Administered 2016-09-11: 40 mg via INTRAVENOUS

## 2016-09-11 MED ORDER — KETOROLAC TROMETHAMINE 30 MG/ML IJ SOLN
INTRAMUSCULAR | Status: DC | PRN
  Administered 2016-09-11: 30 mg via INTRAVENOUS

## 2016-09-11 MED ORDER — DEXAMETHASONE SODIUM PHOSPHATE 10 MG/ML IJ SOLN
INTRAMUSCULAR | Status: DC | PRN
  Administered 2016-09-11: 5 mg via INTRAVENOUS

## 2016-09-11 MED ORDER — DEXAMETHASONE SODIUM PHOSPHATE 10 MG/ML IJ SOLN
INTRAMUSCULAR | Status: AC
  Filled 2016-09-11: qty 1

## 2016-09-11 MED ORDER — FAMOTIDINE 20 MG PO TABS
20.0000 mg | ORAL_TABLET | Freq: Once | ORAL | Status: AC
  Administered 2016-09-11: 20 mg via ORAL

## 2016-09-11 MED ORDER — HYDROMORPHONE HCL 1 MG/ML IJ SOLN
INTRAMUSCULAR | Status: AC
  Filled 2016-09-11: qty 1

## 2016-09-11 MED ORDER — HYDROMORPHONE HCL 1 MG/ML IJ SOLN
INTRAMUSCULAR | Status: DC | PRN
  Administered 2016-09-11: .5 mg via INTRAVENOUS

## 2016-09-11 MED ORDER — SUGAMMADEX SODIUM 200 MG/2ML IV SOLN
INTRAVENOUS | Status: AC
  Filled 2016-09-11: qty 2

## 2016-09-11 MED ORDER — FENTANYL CITRATE (PF) 100 MCG/2ML IJ SOLN
25.0000 ug | INTRAMUSCULAR | Status: DC | PRN
  Administered 2016-09-11 (×3): 50 ug via INTRAVENOUS

## 2016-09-11 MED ORDER — CEFAZOLIN SODIUM-DEXTROSE 2-4 GM/100ML-% IV SOLN
INTRAVENOUS | Status: AC
  Filled 2016-09-11: qty 100

## 2016-09-11 SURGICAL SUPPLY — 27 items
BLADE SURG 15 STRL SS SAFETY (BLADE) ×3 IMPLANT
CANISTER SUCT 1200ML W/VALVE (MISCELLANEOUS) ×3 IMPLANT
CHLORAPREP W/TINT 26ML (MISCELLANEOUS) ×3 IMPLANT
DECANTER SPIKE VIAL GLASS SM (MISCELLANEOUS) IMPLANT
DERMABOND ADVANCED (GAUZE/BANDAGES/DRESSINGS) ×2
DERMABOND ADVANCED .7 DNX12 (GAUZE/BANDAGES/DRESSINGS) ×1 IMPLANT
DRAPE LAPAROTOMY 100X77 ABD (DRAPES) ×3 IMPLANT
ELECT REM PT RETURN 9FT ADLT (ELECTROSURGICAL) ×3
ELECTRODE REM PT RTRN 9FT ADLT (ELECTROSURGICAL) ×1 IMPLANT
GLOVE BIO SURGEON STRL SZ7 (GLOVE) ×18 IMPLANT
GOWN STRL REUS W/ TWL LRG LVL3 (GOWN DISPOSABLE) ×3 IMPLANT
GOWN STRL REUS W/TWL LRG LVL3 (GOWN DISPOSABLE) ×6
KIT RM TURNOVER STRD PROC AR (KITS) ×3 IMPLANT
LABEL OR SOLS (LABEL) ×3 IMPLANT
MESH VENTRALEX ST 1-7/10 CRC S (Mesh General) ×3 IMPLANT
NEEDLE HYPO 25X1 1.5 SAFETY (NEEDLE) ×3 IMPLANT
NS IRRIG 500ML POUR BTL (IV SOLUTION) ×3 IMPLANT
PACK BASIN MINOR ARMC (MISCELLANEOUS) ×3 IMPLANT
SPONGE KITTNER 5P (MISCELLANEOUS) ×3 IMPLANT
SUT PROLENE 0 CT 2 (SUTURE) ×6 IMPLANT
SUT VIC AB 2-0 SH 27 (SUTURE) ×2
SUT VIC AB 2-0 SH 27XBRD (SUTURE) ×1 IMPLANT
SUT VIC AB 3-0 SH 27 (SUTURE) ×2
SUT VIC AB 3-0 SH 27X BRD (SUTURE) ×1 IMPLANT
SUT VIC AB 4-0 FS2 27 (SUTURE) ×3 IMPLANT
SUT VICRYL+ 3-0 144IN (SUTURE) ×3 IMPLANT
SYR CONTROL 10ML (SYRINGE) ×3 IMPLANT

## 2016-09-11 NOTE — H&P (View-Only) (Signed)
Patient ID: Dylan Clinton Sr., male   DOB: 1956-07-30, 61 y.o.   MRN: [id number]  Chief Complaint  Patient presents with  . Follow-up    right groin pain     HPI Dylan TROOST Sr. is a 61 y.o. male here today for his follow up right groin/lpwer abdominal pain which has improved.Dylan Crawford He is using Mobic  now as needed and not everyday. I have reviewed the history of present illness with the patient.   HPI  Past Medical History:  Diagnosis Date  . Allergy    seasonal  . Arthritis   . Bulging lumbar disc   . Colon polyp 2017  . Diverticulosis 2017  . History of chicken pox   . Varicose vein     Past Surgical History:  Procedure Laterality Date  . COLONOSCOPY  2017  . HERNIA REPAIR Right 1979   Inguinal hernia repair    Family History  Problem Relation Age of Onset  . Stroke Other   . Cancer Neg Hx   . Heart disease Neg Hx     Social History Social History  Substance Use Topics  . Smoking status: Never Smoker  . Smokeless tobacco: Never Used  . Alcohol use 0.0 oz/week     Comment: minimal use    No Known Allergies  Current Outpatient Prescriptions  Medication Sig Dispense Refill  . albuterol (PROVENTIL HFA;VENTOLIN HFA) 108 (90 Base) MCG/ACT inhaler Inhale 2 puffs into the lungs every 6 (six) hours as needed for wheezing or shortness of breath. 1 Inhaler 3  . cyclobenzaprine (FLEXERIL) 10 MG tablet Take 1 tablet (10 mg total) by mouth 3 (three) times daily as needed for muscle spasms. 30 tablet 0  . Diclofenac Sodium 2 % SOLN Apply to affected area twice daily 1 Bottle 2  . EPINEPHrine 0.3 mg/0.3 mL IJ SOAJ injection Inject 0.3 mLs (0.3 mg total) into the muscle once. 2 Device 6  . fexofenadine (ALLEGRA) 180 MG tablet Take 180 mg by mouth daily.    Dylan Crawford gabapentin (NEURONTIN) 100 MG capsule Take 2 capsules (200 mg total) by mouth at bedtime. 60 capsule 3  . meloxicam (MOBIC) 7.5 MG tablet Take 1 tablet (7.5 mg total) by mouth daily. (Patient taking differently: Take  7.5 mg by mouth as needed. ) 30 tablet 0  . montelukast (SINGULAIR) 10 MG tablet Take 1 tablet (10 mg total) by mouth at bedtime. 30 tablet 3   No current facility-administered medications for this visit.     Review of Systems Review of Systems  Constitutional: Negative.   Respiratory: Negative.   Cardiovascular: Negative.     Blood pressure 134/82, pulse 78, resp. rate 16, height 6\' 1"  (1.854 m), weight 247 lb (112 kg).  Physical Exam Physical Exam  Constitutional: He is oriented to person, place, and time. He appears well-developed and well-nourished.  HENT:  Mouth/Throat: Oropharynx is clear and moist.  Eyes: Conjunctivae are normal. No scleral icterus.  Neck: Neck supple.  Cardiovascular: Normal rate, regular rhythm and normal heart sounds.   Pulmonary/Chest: Effort normal and breath sounds normal.  Abdominal: Soft. Normal appearance and bowel sounds are normal. There is no tenderness. A hernia is present. Hernia confirmed negative in the right inguinal area and confirmed negative in the left inguinal area.  2 cm partially reducible umbilical hernia   Lymphadenopathy:    He has no cervical adenopathy.  Neurological: He is alert and oriented to person, place, and time.  Skin: Skin is warm  and dry.  Psychiatric: His behavior is normal.    Data Reviewed  Prior notes.  Assessment    2 cm umbilical hernia Groin pain appears to be more muscular, no groin hernia     Plan    Hernia precautions and incarceration were discussed with the patient. If they develop symptoms of an incarcerated hernia, they were encouraged to seek prompt medical attention.  I have recommended repair of the umbilical hernia with possibly using mesh on an outpatient basis in the near future. The risk of infection was reviewed. The role of prosthetic mesh to minimize the risk of recurrence was reviewed.     Patient's surgery has been scheduled for 09-11-16 at [facility].   This information has been  scribed by Karie Fetch RN, BSN,BC.   Deacon Gadbois G 08/22/2016, 10:45 AM

## 2016-09-11 NOTE — Anesthesia Preprocedure Evaluation (Signed)
Anesthesia Evaluation  Patient identified by MRN, date of birth, ID band Patient awake    Reviewed: Allergy & Precautions, H&P , NPO status , Patient's Chart, lab work & pertinent test results, reviewed documented beta blocker date and time   History of Anesthesia Complications Negative for: history of anesthetic complications  Airway Mallampati: III  TM Distance: >3 FB Neck ROM: full    Dental no notable dental hx. (+) Caps, Implants   Pulmonary neg pulmonary ROS,    Pulmonary exam normal breath sounds clear to auscultation       Cardiovascular Exercise Tolerance: Good negative cardio ROS Normal cardiovascular exam Rhythm:regular Rate:Normal     Neuro/Psych neg Seizures  Neuromuscular disease negative psych ROS   GI/Hepatic negative GI ROS, Neg liver ROS,   Endo/Other  negative endocrine ROS  Renal/GU negative Renal ROS  negative genitourinary   Musculoskeletal   Abdominal   Peds  Hematology negative hematology ROS (+)   Anesthesia Other Findings Past Medical History: No date: Allergy     Comment: seasonal No date: Arthritis No date: Bulging lumbar disc 2017: Colon polyp 2017: Diverticulosis No date: History of chicken pox No date: Seasonal allergies     Comment: HAS ALBUTEROL INHALER TO USE PRN No date: Varicose vein   Reproductive/Obstetrics negative OB ROS                             Anesthesia Physical Anesthesia Plan  ASA: II  Anesthesia Plan: General   Post-op Pain Management:    Induction:   Airway Management Planned:   Additional Equipment:   Intra-op Plan:   Post-operative Plan:   Informed Consent: I have reviewed the patients History and Physical, chart, labs and discussed the procedure including the risks, benefits and alternatives for the proposed anesthesia with the patient or authorized representative who has indicated his/her understanding and  acceptance.   Dental Advisory Given  Plan Discussed with: Anesthesiologist, CRNA and Surgeon  Anesthesia Plan Comments:         Anesthesia Quick Evaluation

## 2016-09-11 NOTE — Anesthesia Post-op Follow-up Note (Cosign Needed)
Anesthesia QCDR form completed.        

## 2016-09-11 NOTE — Anesthesia Procedure Notes (Signed)
Procedure Name: LMA Insertion Date/Time: 09/11/2016 9:21 AM Performed by: [facility] Pre-anesthesia Checklist: Patient identified, Emergency Drugs available, Suction available and Patient being monitored Patient Re-evaluated:Patient Re-evaluated prior to inductionOxygen Delivery Method: Circle system utilized Preoxygenation: Pre-oxygenation with 100% oxygen Intubation Type: IV induction LMA: LMA inserted LMA Size: 4.5 Placement Confirmation: ETT inserted through vocal cords under direct vision,  positive ETCO2 and CO2 detector

## 2016-09-11 NOTE — Transfer of Care (Signed)
Immediate Anesthesia Transfer of Care Note  Patient: Dylan MESMER Sr.  Procedure(s) Performed: Procedure(s): HERNIA REPAIR UMBILICAL ADULT with mesh (N/A)  Patient Location: PACU  Anesthesia Type:General  Level of Consciousness: sedated  Airway & Oxygen Therapy: Patient Spontanous Breathing and Patient connected to face mask oxygen  Post-op Assessment: Report given to RN and Post -op Vital signs reviewed and stable  Post vital signs: Reviewed and stable  Last Vitals:  Vitals:   09/11/16 0815  BP: 125/88  Pulse: 70  Resp: 18  Temp: 36.6 C    Last Pain:  Vitals:   09/11/16 0815  TempSrc: Oral         Complications: No apparent anesthesia complications

## 2016-09-11 NOTE — Interval H&P Note (Signed)
History and Physical Interval Note:  09/11/2016 8:41 AM  Dylan Clinton Sr.  has presented today for surgery, with the diagnosis of UMBILICAL HERNIA  The various methods of treatment have been discussed with the patient and family. After consideration of risks, benefits and other options for treatment, the patient has consented to  Procedure(s): HERNIA REPAIR UMBILICAL ADULT (N/A) as a surgical intervention .  The patient's history has been reviewed, patient examined, no change in status, stable for surgery.  I have reviewed the patient's chart and labs.  Questions were answered to the patient's satisfaction.     Dominion Kathan G

## 2016-09-11 NOTE — Anesthesia Postprocedure Evaluation (Signed)
Anesthesia Post Note  Patient: Dylan BUOL Sr.  Procedure(s) Performed: Procedure(s) (LRB): HERNIA REPAIR UMBILICAL ADULT with mesh (N/A)  Patient location during evaluation: PACU Anesthesia Type: General Level of consciousness: awake and alert Pain management: pain level controlled Vital Signs Assessment: post-procedure vital signs reviewed and stable Respiratory status: spontaneous breathing, nonlabored ventilation, respiratory function stable and patient connected to nasal cannula oxygen Cardiovascular status: blood pressure returned to baseline and stable Postop Assessment: no signs of nausea or vomiting Anesthetic complications: no     Last Vitals:  Vitals:   09/11/16 1129 09/11/16 1151  BP: (!) 126/96 125/82  Pulse:  61  Resp:  16  Temp:      Last Pain:  Vitals:   09/11/16 1151  TempSrc:   PainSc: 3                  Martha Clan

## 2016-09-11 NOTE — Discharge Instructions (Signed)

## 2016-09-11 NOTE — Op Note (Signed)
Preop diagnosis: Umbilical hernia  Post op diagnosis: Same  Operation: Repair umbilical hernia with Ventralex ST patch  Surgeon: Mckinley Jewel  Assistant:     Anesthesia: Gen.  Complications: None  EBL: Less than 2 mL  Drains: None  Description: Patient was put to sleep with an LMA and the umbilical area prepped and draped as sterile field. A vertical incision was mapped out going along the right side of the umbilicus. Timeout was performed. Local anesthetic of 0.5% Marcaine totaling 30 mL was instilled around the area for postop analgesia. Skin incision was made and deepened through to expose a hernia protrusion of the preperitoneal fat. Cautery was used to control bleeding with careful exposure the hernia protrusion was freed all the way down to the fascial opening. A good portion of this was then clamped amputated and ligated with 3-0 Vicryl. Rest of it there was then pushed back into the preperitoneal space without any difficulty. The fascial opening was transversely oriented measured close to 1.5 cm. The fascial edges were lifted up and the preperitoneal space was dissected off to allow placement of a 4 cm Ventralex ST patch. This patch was positioned satisfactorily and the straps were lifted up. 0 proline stitches were the used to close the fascial defect transversely incorporating the anterior leaves on the excess of the leaves then cut off. Repair was complete and satisfactory. Wound was irrigated. Subcutaneous change tissue closed with interrupted 3-0 Vicryl. The skin closed with subcuticular 4-0 Vicryl covered with Dermabond. Procedure was well-tolerated with no immediate problems encountered. Patient subsequently returned recovery room stable condition.

## 2016-09-18 ENCOUNTER — Ambulatory Visit (INDEPENDENT_AMBULATORY_CARE_PROVIDER_SITE_OTHER): Payer: Managed Care, Other (non HMO) | Admitting: General Surgery

## 2016-09-18 ENCOUNTER — Encounter: Payer: Self-pay | Admitting: General Surgery

## 2016-09-18 VITALS — BP 122/78 | HR 76 | Resp 12 | Ht 73.0 in | Wt 241.0 lb

## 2016-09-18 DIAGNOSIS — K429 Umbilical hernia without obstruction or gangrene: Secondary | ICD-10-CM

## 2016-09-18 NOTE — Patient Instructions (Addendum)
The patient is aware to call back for any questions or concerns. Resume activities as tolerated in one week.  Follow up in 5 weeks.

## 2016-09-18 NOTE — Progress Notes (Addendum)
Patient ID: Dylan Clinton Sr., male   DOB: 12-15-1955, 61 y.o.   MRN: [id number]  Chief Complaint  Patient presents with  . Routine Post Op    HPI Dylan BULA Sr. is a 61 y.o. male.  Here today for postoperative umbilical hernia repair visit, he states he is doing well. Denies any gastrointestinal issues, bowels are moving regular. I have reviewed the history of present illness with the patient.   HPI  Past Medical History:  Diagnosis Date  . Allergy    seasonal  . Arthritis   . Bulging lumbar disc   . Colon polyp 2017  . Diverticulosis 2017  . History of chicken pox   . Seasonal allergies    HAS ALBUTEROL INHALER TO USE PRN  . Varicose vein     Past Surgical History:  Procedure Laterality Date  . COLONOSCOPY  2017  . HERNIA REPAIR Right 1979   Inguinal hernia repair  . UMBILICAL HERNIA REPAIR N/A 09/11/2016   Procedure: HERNIA REPAIR UMBILICAL ADULT with mesh;  Surgeon: [name], MD;  Location: [facility] ORS;  Service: General;  Laterality: N/A;    Family History  Problem Relation Age of Onset  . Stroke Other   . Cancer Neg Hx   . Heart disease Neg Hx     Social History Social History  Substance Use Topics  . Smoking status: Never Smoker  . Smokeless tobacco: Never Used  . Alcohol use 0.0 oz/week     Comment: minimal use    No Known Allergies  Current Outpatient Prescriptions  Medication Sig Dispense Refill  . albuterol (PROVENTIL HFA;VENTOLIN HFA) 108 (90 Base) MCG/ACT inhaler Inhale 2 puffs into the lungs every 6 (six) hours as needed for wheezing or shortness of breath. 1 Inhaler 3  . Cholecalciferol (VITAMIN D) 2000 units CAPS Take 1 capsule by mouth as needed.    . Diclofenac Sodium 2 % SOLN Apply to affected area twice daily (Patient taking differently: Apply 1 Crawford topically daily as needed (pain). Apply to affected area twice daily) 1 Bottle 2  . EPINEPHrine 0.3 mg/0.3 mL IJ SOAJ injection Inject 0.3 mLs (0.3 mg total) into the muscle once.  2 Device 6  . fexofenadine (ALLEGRA) 180 MG tablet Take 180 mg by mouth every morning.     . gabapentin (NEURONTIN) 100 MG capsule TAKE 2 CAPSULES (200 MG TOTAL) BY MOUTH AT BEDTIME. 60 capsule 3  . IRON PO Take 1 tablet by mouth as needed.    . montelukast (SINGULAIR) 10 MG tablet Take 1 tablet (10 mg total) by mouth at bedtime. 30 tablet 3   No current facility-administered medications for this visit.     Review of Systems Review of Systems  Constitutional: Negative.   Respiratory: Negative.   Cardiovascular: Negative.     Blood pressure 122/78, pulse 76, resp. rate 12, height 6\' 1"  (1.854 m), weight 241 lb (109.3 kg).  Physical Exam Physical Exam  Constitutional: He is oriented to person, place, and time. He appears well-developed and well-nourished.  Abdominal: Soft. Normal appearance and bowel sounds are normal. There is no tenderness.    Mild ecchymosis, abdominal incision clean.   Neurological: He is alert and oriented to person, place, and time.  Skin: Skin is warm and dry.  Psychiatric: His behavior is normal.    Data Reviewed Prior notes  Assessment    Post-operative umbilical hernia repair. Incision site is healing well, good progress so far.    Plan  Follow up in 5 weeks. Resume activities as tolerated in one week. Patient is returning to work on 09-26-2016. Work note given with weight lifting restriction of 15 pounds for two weeks. The patient is aware to call back for any questions or concerns.      This information has been scribed by Karie Fetch RN, BSN,BC.   Dylan Crawford 10/10/2016, 4:38 PM

## 2016-10-09 ENCOUNTER — Encounter: Payer: Self-pay | Admitting: Physician Assistant

## 2016-10-09 ENCOUNTER — Ambulatory Visit: Payer: Self-pay | Admitting: Physician Assistant

## 2016-10-09 VITALS — BP 140/80 | HR 87 | Temp 98.4°F

## 2016-10-09 DIAGNOSIS — J069 Acute upper respiratory infection, unspecified: Secondary | ICD-10-CM

## 2016-10-09 NOTE — Progress Notes (Signed)
S: C/o runny nose and congestion for 8 days, no fever, chills, cp/sob, v/d; mucus was green this am but clear throughout the day, cough is sporadic, had some wheezing at night but used inhaler and it got better,   Using otc meds: mucinex and decongestant  O: PE: perrl eomi, normocephalic, tms dull, nasal mucosa red and swollen, throat injected, neck supple no lymph, lungs c t a, cv rrr, neuro intact  A:  Acute viral uri   P: drink fluids, continue regular meds , use otc meds of choice, return if not improving in 5 days, return earlier if worsening , if worsening will call in an antibiotic

## 2016-10-23 ENCOUNTER — Ambulatory Visit (INDEPENDENT_AMBULATORY_CARE_PROVIDER_SITE_OTHER): Payer: Managed Care, Other (non HMO) | Admitting: General Surgery

## 2016-10-23 ENCOUNTER — Encounter: Payer: Self-pay | Admitting: General Surgery

## 2016-10-23 VITALS — BP 138/82 | HR 76 | Resp 14 | Ht 73.0 in | Wt 246.0 lb

## 2016-10-23 DIAGNOSIS — K429 Umbilical hernia without obstruction or gangrene: Secondary | ICD-10-CM

## 2016-10-23 NOTE — Progress Notes (Signed)
Patient ID: Dylan Clinton Sr., male   DOB: 02-16-1956, 61 y.o.   MRN: [id number]  Chief Complaint  Patient presents with  . Routine Post Op    HPI Dylan UTECHT Sr. is a 61 y.o. male.  Here today for postoperative visit, he states he is doing well. Denies any gastrointestinal issues, bowels are moving regularly. He has returned to work. I have reviewed the history of present illness with the patient.  HPI  Past Medical History:  Diagnosis Date  . Allergy    seasonal  . Arthritis   . Bulging lumbar disc   . Colon polyp 2017  . Diverticulosis 2017  . History of chicken pox   . Seasonal allergies    HAS ALBUTEROL INHALER TO USE PRN  . Varicose vein     Past Surgical History:  Procedure Laterality Date  . COLONOSCOPY  2017  . HERNIA REPAIR Right 1979   Inguinal hernia repair  . UMBILICAL HERNIA REPAIR N/A 09/11/2016   Procedure: HERNIA REPAIR UMBILICAL ADULT with mesh;  Surgeon: [name], MD;  Location: [facility] ORS;  Service: General;  Laterality: N/A;    Family History  Problem Relation Age of Onset  . Stroke Other   . Cancer Neg Hx   . Heart disease Neg Hx     Social History Social History  Substance Use Topics  . Smoking status: Never Smoker  . Smokeless tobacco: Never Used  . Alcohol use 0.0 oz/week     Comment: minimal use    No Known Allergies  Current Outpatient Prescriptions  Medication Sig Dispense Refill  . albuterol (PROVENTIL HFA;VENTOLIN HFA) 108 (90 Base) MCG/ACT inhaler Inhale 2 puffs into the lungs every 6 (six) hours as needed for wheezing or shortness of breath. 1 Inhaler 3  . Cholecalciferol (VITAMIN D) 2000 units CAPS Take 1 capsule by mouth as needed.    . Diclofenac Sodium 2 % SOLN Apply to affected area twice daily (Patient taking differently: Apply 1 g topically daily as needed (pain). Apply to affected area twice daily) 1 Bottle 2  . EPINEPHrine 0.3 mg/0.3 mL IJ SOAJ injection Inject 0.3 mLs (0.3 mg total) into the muscle once.  2 Device 6  . fexofenadine (ALLEGRA) 180 MG tablet Take 180 mg by mouth every morning.     . gabapentin (NEURONTIN) 100 MG capsule TAKE 2 CAPSULES (200 MG TOTAL) BY MOUTH AT BEDTIME. 60 capsule 3  . IRON PO Take 1 tablet by mouth as needed.    . montelukast (SINGULAIR) 10 MG tablet Take 1 tablet (10 mg total) by mouth at bedtime. 30 tablet 3   No current facility-administered medications for this visit.     Review of Systems Review of Systems  Constitutional: Negative.   Respiratory: Negative.   Cardiovascular: Negative.     Blood pressure 138/82, pulse 76, resp. rate 14, height 6\' 1"  (1.854 m), weight 246 lb (111.6 kg).  Physical Exam Physical Exam  Constitutional: He is oriented to person, place, and time. He appears well-developed and well-nourished.  Abdominal: Soft. Normal appearance. There is no tenderness. No hernia.  Umbilical hernia repair is intact and well healed   Neurological: He is alert and oriented to person, place, and time.  Skin: Skin is warm and dry.  Psychiatric: His behavior is normal.    Data Reviewed Notes reviewed   Assessment    Stable , healed repair of umbilical hernia    Plan    Patient to return as  needed. No activity restriction     This information has been scribed by Karie Fetch RN, BSN,BC.  Latrel Szymczak G 10/23/2016, 10:15 AM

## 2016-10-23 NOTE — Patient Instructions (Addendum)
The patient is aware to call back for any questions or concerns. Return as needed.  

## 2016-12-06 ENCOUNTER — Ambulatory Visit: Payer: Self-pay | Admitting: Physician Assistant

## 2016-12-06 VITALS — BP 130/89 | HR 86 | Temp 98.8°F

## 2016-12-06 DIAGNOSIS — B349 Viral infection, unspecified: Secondary | ICD-10-CM

## 2016-12-06 MED ORDER — METHYLPREDNISOLONE 4 MG PO TBPK: ORAL_TABLET | ORAL | 0 refills | Status: DC

## 2016-12-06 NOTE — Progress Notes (Signed)
S: C/o runny nose and congestion with dry cough for 6 days, + fever, chills and body aches at the beginnng, none in 2 days but is still coughing and wheezing, denies cp/sob, v/d; wife had the same sx  Using otc meds: cold meds  O: PE: vitals wnl, nad,  perrl eomi, normocephalic, tms dull, nasal mucosa red and swollen, throat injected, neck supple no lymph, lungs c t a, cv rrr, neuro intact,  A:  Acute flu like illness   P: drink fluids, continue regular meds , use otc meds of choice, return if not improving in 5 days, return earlier if worsening , medrol dose pack

## 2017-01-02 ENCOUNTER — Other Ambulatory Visit: Payer: Self-pay | Admitting: Physician Assistant

## 2017-01-02 DIAGNOSIS — Z9109 Other allergy status, other than to drugs and biological substances: Secondary | ICD-10-CM

## 2017-01-03 NOTE — Telephone Encounter (Signed)
Med refill for singulair, pt has severe allergies and has been on the medication for awhile.

## 2017-01-24 ENCOUNTER — Ambulatory Visit: Payer: Self-pay | Admitting: Physician Assistant

## 2017-01-24 ENCOUNTER — Encounter: Payer: Self-pay | Admitting: Physician Assistant

## 2017-01-24 VITALS — BP 124/90 | HR 76 | Temp 98.6°F

## 2017-01-24 DIAGNOSIS — Z0000 Encounter for general adult medical examination without abnormal findings: Secondary | ICD-10-CM

## 2017-01-24 NOTE — Progress Notes (Signed)
S: pt here for wellness physical had biometrics for insurance purposes done work, no complaints ros neg. PMH:  Allergies, hernia surg  Social: nonsmoker, no drugs, rare etoh, married  Fam: mother's side of family has colon ca and thyroid problems  O: vitals wnl, nad, ENT wnl, neck supple no lymph, lungs c t a, cv rrr, abd soft nontender bs normal all 4 quads  A: wellness physical  P:  F/u prn, will do fasting labs for next year's physical

## 2017-02-28 IMAGING — DX DG KNEE COMPLETE 4+V*R*
4 series · 4 of 4 positions shown · non-contrast
Comparison: None.

CLINICAL DATA: Knee pain and swelling.  No known injury.

EXAM:
RIGHT KNEE - COMPLETE 4+ VIEW

[knee ap]
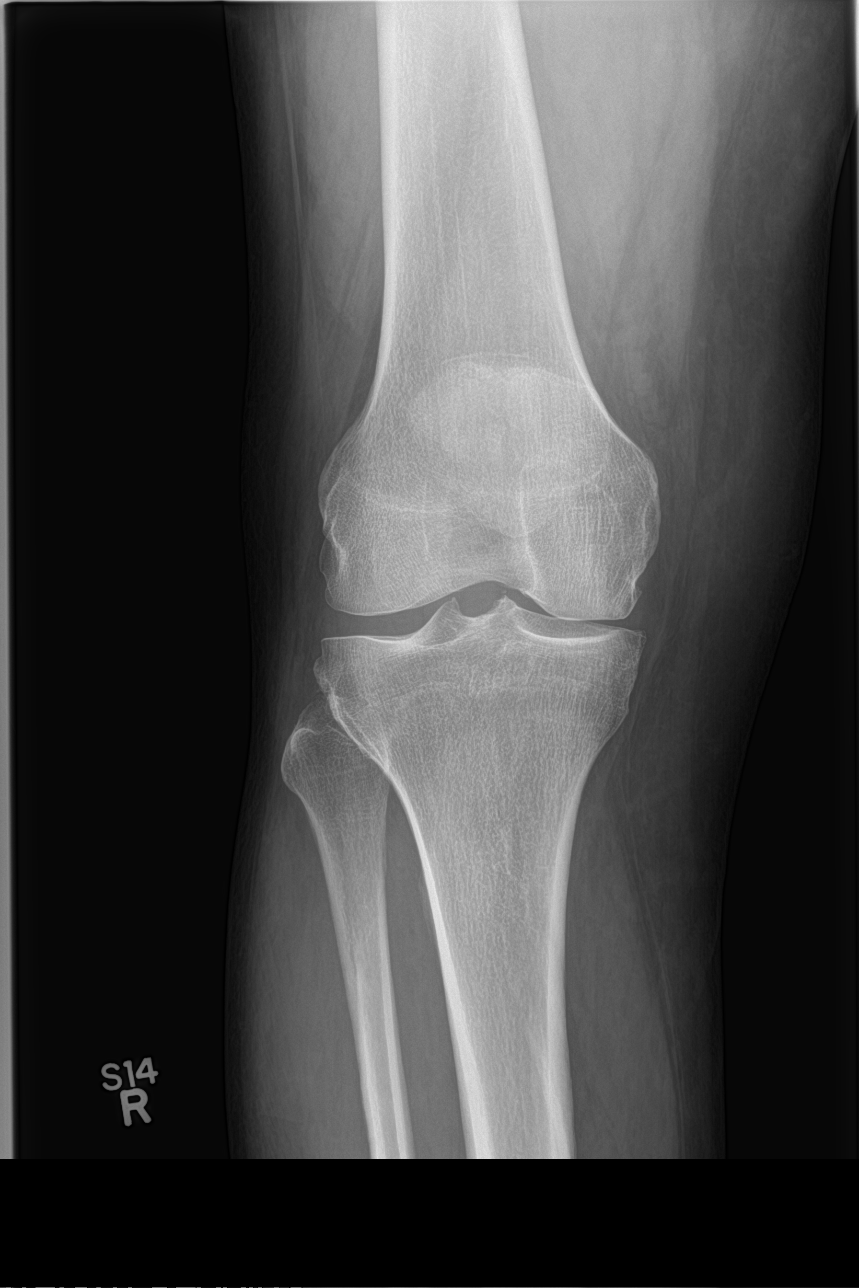

[knee tunnel]
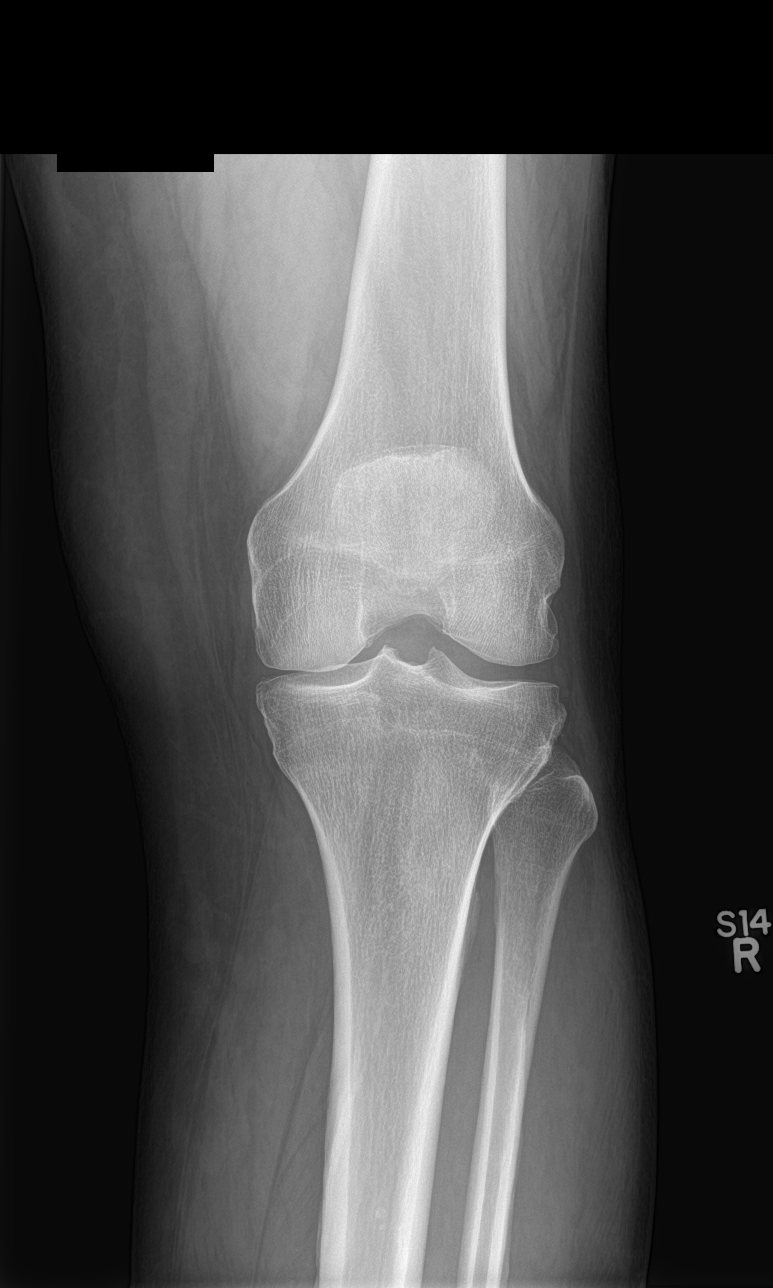

[knee lat]
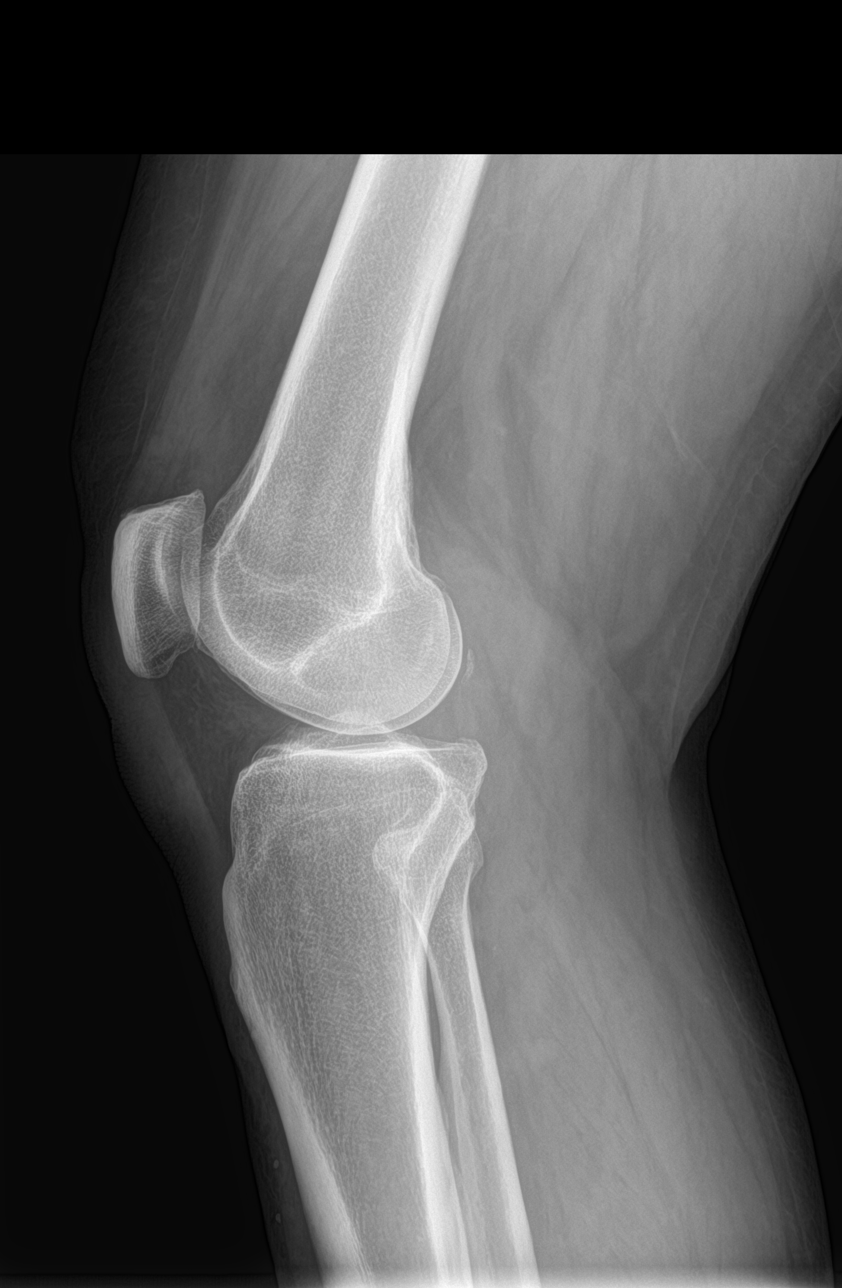

[sunrise]
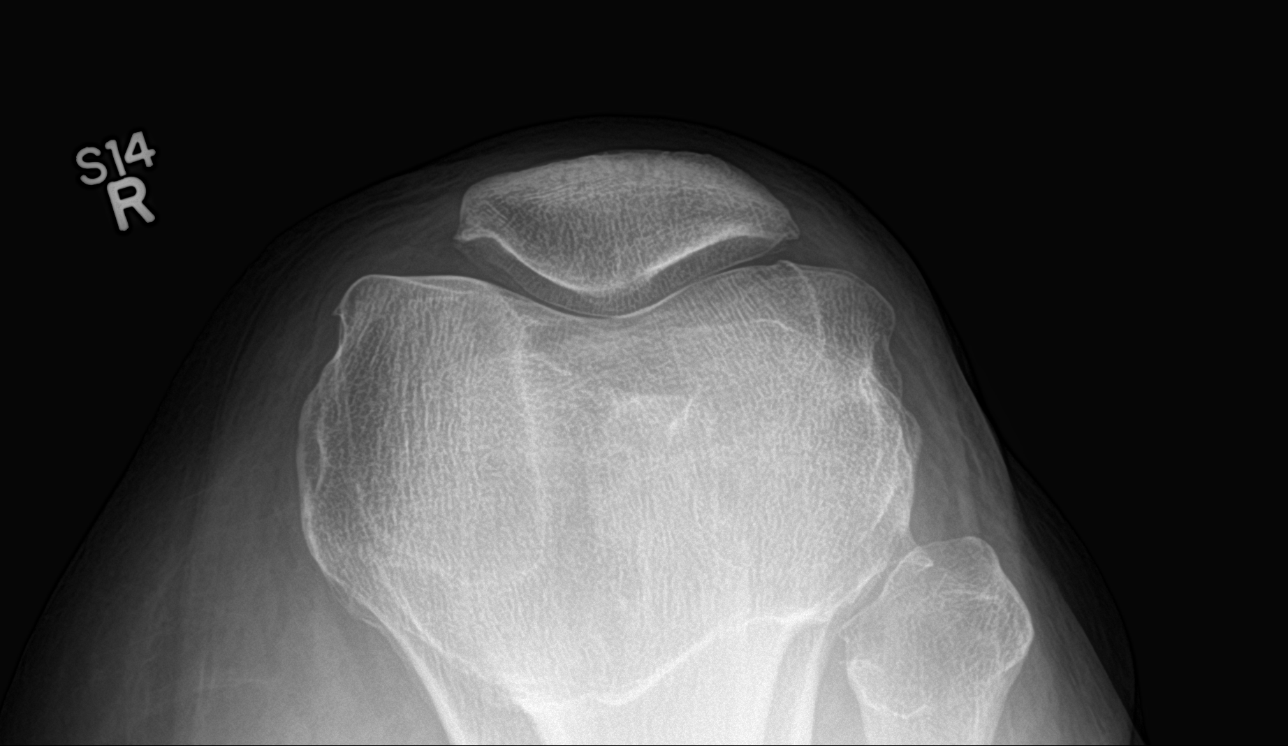

[4 of 4 positions shown; findings below may reference images not displayed]

FINDINGS: No acute bony or joint abnormality identified. Tricompartment
degenerative change noted. Small loose bodies noted. No acute bony
abnormality identified .
IMPRESSION: Mild tricompartment degenerative change noted with small loose
bodies. No acute bony abnormality identified.

## 2017-11-07 ENCOUNTER — Encounter: Payer: Self-pay | Admitting: Family Medicine

## 2017-11-07 ENCOUNTER — Ambulatory Visit: Payer: Self-pay | Admitting: Family Medicine

## 2017-11-07 VITALS — BP 120/88 | HR 69 | Temp 98.5°F | Resp 16 | Ht 73.0 in | Wt 241.0 lb

## 2017-11-07 DIAGNOSIS — Z0189 Encounter for other specified special examinations: Principal | ICD-10-CM

## 2017-11-07 DIAGNOSIS — Z008 Encounter for other general examination: Secondary | ICD-10-CM

## 2017-11-07 LAB — GLUCOSE, POCT (MANUAL RESULT ENTRY): POC Glucose: 98 mg/dl (ref 70–99)

## 2017-11-07 NOTE — Progress Notes (Signed)
Subjective: Annual biometrics screening  Patient presents for his annual biometric screening.  Patient's only complaint is right knee pain intermittently.  Patient was diagnosed with right knee osteoarthritis and is under the treatment of an orthopedist in [place].  PCP: Dr. Rosanna Randy, although patient is unsure if he is still considered active at this practice. 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 Patient denies any other issues or concerns.   Review of Systems Constitutional: Unremarkable.  HEENT: Unremarkable.  Gastrointestinal: Unremarkable.  Umbilical repair last January, uncomplicated recovery, full resolution of symptoms. Respiratory: Unremarkable.   Cardiovascular: Unremarkable.  Musculoskeletal: Unremarkable other than what is stated above. ROS otherwise negative.   Objective  Physical Exam General: Awake, alert and oriented. No acute distress. Well developed, hydrated and nourished. Appears stated age.  HEENT: Supple neck without adenopathy. Sclera is non-icteric. The ear canal is clear without discharge. The tympanic membrane is normal in appearance with normal landmarks and cone of light. Nasal mucosa is pink and moist. Oral mucosa is pink and moist. The pharynx is normal in appearance without tonsillar swelling or exudates.  Skin: Skin in warm, dry and intact without rashes or lesions. Appropriate color for ethnicity. Cardiac: Heart rate and rhythm are normal. No murmurs, gallops, or rubs are auscultated.  Respiratory: The chest wall is symmetric and without deformity. No signs of respiratory distress. Lung sounds are clear in all lobes bilaterally without rales, ronchi, or wheezes.  Abdominal: Abdomen is soft, symmetric, and non-tender without distention. No masses, hepatomegaly, or splenomegaly are noted.  Spine: Neck and back are without deformity. Curvature of the cervical, thoracic, and lumbar spine are within normal limits. Posture is upright, gait is smooth, steady, and  within normal limits. No tenderness noted on palpation of the spinous processes. Spinous processes are midline. No discomfort is noted with flexion, extension, and side-to-side rotation of the cervical/thoracic/lumbar spine, full range of motion is noted. Grip strength is normal bilaterally.  Extremities: Full range of motion is noted to all major joints. Steady gait noted.  Neurological: The patient is awake, alert and oriented to person, place, and time with normal speech.  Memory is normal and thought processes intact. No gait abnormalities are appreciated.  Psychiatric: Appropriate mood and affect.   Assessment Annual biometrics screen  Plan  Labs pending. Encouraged routine visits with primary care provider.  Provided patient with resources to establish care with a new primary care provider if he is no longer active with Dr. Rosanna Randy. Fasting blood sugar 98 today.

## 2017-11-09 LAB — LIPID PANEL
Chol/HDL Ratio: 3.6 ratio (ref 0.0–5.0)
Cholesterol, Total: 151 mg/dL (ref 100–199)
HDL: 42 mg/dL (ref >39)
LDL CALC: 96 mg/dL (ref 0–99)
TRIGLYCERIDES: 64 mg/dL (ref 0–149)
VLDL CHOLESTEROL CAL: 13 mg/dL (ref 5–40)

## 2017-11-11 NOTE — Progress Notes (Signed)
Dylan Crawford, Will you inform this patient that his LDL cholesterol ("bad cholesterol") is very slightly elevated at 101? Normal value is below 99.  This is a very slight increase but I wanted him to be aware.  Tell him to follow-up his primary care provider regarding this.

## 2017-12-20 ENCOUNTER — Ambulatory Visit: Payer: Self-pay | Admitting: Family Medicine

## 2017-12-20 VITALS — BP 137/90 | HR 74 | Temp 98.4°F | Resp 16

## 2017-12-20 DIAGNOSIS — R0689 Other abnormalities of breathing: Secondary | ICD-10-CM

## 2017-12-20 DIAGNOSIS — J309 Allergic rhinitis, unspecified: Secondary | ICD-10-CM

## 2017-12-20 DIAGNOSIS — Z9109 Other allergy status, other than to drugs and biological substances: Secondary | ICD-10-CM

## 2017-12-20 DIAGNOSIS — J45909 Unspecified asthma, uncomplicated: Secondary | ICD-10-CM

## 2017-12-20 MED ORDER — ALBUTEROL SULFATE HFA 108 (90 BASE) MCG/ACT IN AERS: 2.0000 | INHALATION_SPRAY | Freq: Four times a day (QID) | RESPIRATORY_TRACT | 1 refills | Status: DC | PRN

## 2017-12-20 MED ORDER — FLUTICASONE PROPIONATE 50 MCG/ACT NA SUSP: 2.0000 | Freq: Every day | NASAL | 1 refills | Status: DC

## 2017-12-20 NOTE — Progress Notes (Signed)
Subjective: Cough     Dylan Clinton Sr. is a 62 y.o. male who presents for evaluation of rhinorrhea, sneezing, nasal/ocular pruritus, fatigue, productive cough with green sputum, and sore throat for 2 weeks.  Patient reports symptoms are overall improving, with the most significant improvement in the last 3 to 4 days.  Patient reports a history of allergic rhinitis and that he believes the symptoms may be due to being outside for prolonged periods of time in the last 2 weeks.  Patient reports it is the worst following mowing the lawn.  Patient takes Allegra daily for allergic rhinitis.  Patient also has a history of asthma but reports very infrequent need  to use his albuterol inhaler.  Patient reports using  his albuterol inhaler only a few times a year but that he has used it 4-5 times in the last 2 weeks for cough.  Patient is prescribed Singulair but is not currently taking this because he did not know if he needed it. Patient does not currently have a PCP. Sick contacts: Patient's wife has an upper respiratory infection. Treatment to date: Albuterol, dextromethorphan, and Mucinex.  Denies nausea, vomiting, diarrhea, rash, shortness of breath, wheezing, chest or back pain, ear pain, difficulty swallowing, confusion, purulent nasal congestion, facial/dental pressure, headache, body aches, fever, chills, severe symptoms, or initial improvement and then worsening of symptoms. History of smoking, asthma, COPD: Asthma as indicated above.  Otherwise negative History of recurrent sinus and/or lung infections: Negative.  Review of Systems Pertinent items noted in HPI and remainder of comprehensive ROS otherwise negative.     Objective:   Physical Exam General: Awake, alert, and oriented. No acute distress. Well developed, hydrated and nourished. Appears stated age. Nontoxic appearance.  HEENT: No PND noted.  No erythema to posterior oropharynx.  No edema or exudates of pharynx or tonsils.  No  peritonsillar swelling or abscess.  Uvula midline.  No erythema or bulging of TM.  Mild erythema/edema to nasal mucosa. Sinuses nontender. Supple neck without adenopathy. Cardiac: Heart rate and rhythm are normal. No murmurs, gallops, or rubs are auscultated. S1 and S2 are heard and are of normal intensity.  Respiratory: No signs of respiratory distress. Lungs clear. No tachypnea. Able to speak in full sentences without dyspnea. Nonlabored respirations.  Skin: Skin is warm, dry and intact. Appropriate color for ethnicity. No cyanosis noted.   Assessment:    allergic rhinitis and viral upper respiratory illness   Plan:    Discussed diagnosis and treatment of URI. Discussed the importance of avoiding unnecessary antibiotic therapy. Suggested symptomatic OTC remedies. Nasal saline spray for congestion. Nasal steroids per orders.   Encouraged patient to establish care with a primary care provider. Encouraged patient to take Singulair as prescribed.  Provided refill for albuterol and advised patient to use this as needed. Discussed side/adverse effects of medications prescribed. Discussed red flag symptoms and circumstances with which to seek medical care.   New Prescriptions   FLUTICASONE (FLONASE) 50 MCG/ACT NASAL SPRAY    Place 2 sprays into both nostrils daily.

## 2018-10-18 DIAGNOSIS — Z8673 Personal history of transient ischemic attack (TIA), and cerebral infarction without residual deficits: Secondary | ICD-10-CM | POA: Insufficient documentation

## 2018-10-23 ENCOUNTER — Telehealth: Payer: Self-pay | Admitting: Family Medicine

## 2018-10-23 NOTE — Telephone Encounter (Signed)
Pt was admitted to [facility] with a stroke and discharged on Tues,10-21-18 .  He is needing a hospital f/u with Dr. Caryn Section. Nothing available on schedule tomorrow and next week. Please call pt back with a time Dr. Caryn Section could see him if possible.  Thanks, [employer]

## 2018-10-24 NOTE — Telephone Encounter (Signed)
Please review. Thanks!  

## 2018-10-24 NOTE — Telephone Encounter (Signed)
Has to be seen within 14 days of discharge. He can have the 9am or 2pm on March 9. Otherwise he will have to see another provider.

## 2018-10-24 NOTE — Telephone Encounter (Signed)
Is it ok for patient to wait 2 weeks for HFU? Please advise. Thanks!

## 2018-11-03 ENCOUNTER — Ambulatory Visit: Payer: Managed Care, Other (non HMO) | Admitting: Family Medicine

## 2018-11-03 ENCOUNTER — Other Ambulatory Visit: Payer: Self-pay

## 2018-11-03 ENCOUNTER — Encounter: Payer: Self-pay | Admitting: Family Medicine

## 2018-11-03 VITALS — BP 126/90 | Temp 97.9°F | Ht 73.0 in | Wt 243.0 lb

## 2018-11-03 DIAGNOSIS — I63512 Cerebral infarction due to unspecified occlusion or stenosis of left middle cerebral artery: Secondary | ICD-10-CM

## 2018-11-03 NOTE — Progress Notes (Signed)
Patient: Dylan DEPAZ Sr. Male    DOB: October 01, 1955   63 y.o.   MRN: [id number] Visit Date: 11/03/2018  Today's Provider: Lelon Huh, MD   Chief Complaint  Patient presents with  . Hospitalization Follow-up    due to stroke like symptoms on 10/18/18   Subjective:     HPI   Follow up Hospitalization  Patient was admitted to [facility] on 10/18/18 and discharged on 10/21/18. He initially presented with right-sided weakness, numbness on right side of face and right leg, and dysarthria that developed while working under the sink.  He was treated for stroke with TPA for left MCA syndrome with near resolution o sx during treatment.   Treatment for this included added aspirin 81 mg and lipitor 80 mg. Telephone follow up was done on 10/24/18 spoke to Kirstie Mirza He reports excellent compliance with treatment. He reports this condition is Improved. Has been a little achy since discharge.    IMPRESSION: MRI Few tiny foci of acute infarction in the left frontal lobe. No acute hemorrhage or mass effect.  INTERPRETATION:Echo  NORMAL LEFT VENTRICULAR SYSTOLIC FUNCTION WITH MILD LVH  NORMAL LA PRESSURES WITH NORMAL DIASTOLIC FUNCTION  NORMAL RIGHT VENTRICULAR SYSTOLIC FUNCTION  VALVULAR REGURGITATION: TRIVIAL AR, TRIVIAL MR, TRIVIAL TR  NO VALVULAR STENOSIS  DEFINITY IMAGING AGENT USED TO ENHANCE LV BORDERS  MOBILE TARGET SEEN WITHIN LV, MAY BE LV FALSE TENDONS. CANNOT EXCLUDE  OTHER PATHOLOGY. RECOMMEND TEE IF CLINICALLY INDICATED.  NO PRIOR STUDY FOR COMPARISON  Carotid u/s Right:  Bulb: Moderate, heterogenous plaque measuring 2.9-3.42m creating  <50% stenosis  ICA: Minimal, heterogenous plaque measuring 1.3-1.528mcreating  <50% stenosis  CCA: Normal  ECA: Normal  VA: Normal with antegrade flow  Subclavian: Normal   Left:  Bulb: Moderate, heterogenous, calcified, smooth plaque measuring  2.3-3.33m26mreating <50% stenosis. Homogenous, soft plaque also    visualized.  ICA: Moderate, heterogenous, calcified, smooth plaque measuring  2.4mm59meating <50% stenosis  CCA: Normal  ECA: Normal  VA: Normal with antegrade flow  Subclavian: Normal   Baseline lipids, chol=150, LDL=103, HDL=35, normal met c and cbcHe has [facility]    No Known Allergies   Current Outpatient Medications:  .  albuterol (PROVENTIL HFA;VENTOLIN HFA) 108 (90 Base) MCG/ACT inhaler, Inhale 2 puffs into the lungs every 6 (six) hours as needed for wheezing or shortness of breath., Disp: 1 Inhaler, Rfl: 1 .  aspirin EC 81 MG tablet, Take 81 mg by mouth daily., Disp: , Rfl:  .  atorvastatin (LIPITOR) 80 MG tablet, Take 80 mg by mouth daily., Disp: , Rfl:  .  Diclofenac Sodium 2 % SOLN, Apply to affected area twice daily (Patient taking differently: Apply 1 g topically daily as needed (pain). Apply to affected area twice daily), Disp: 1 Bottle, Rfl: 2 .  EPINEPHrine 0.3 mg/0.3 mL IJ SOAJ injection, Inject 0.3 mLs (0.3 mg total) into the muscle once., Disp: 2 Device, Rfl: 6 .  fexofenadine (ALLEGRA) 180 MG tablet, Take 180 mg by mouth every morning. , Disp: , Rfl:  .  fluticasone (FLONASE) 50 MCG/ACT nasal spray, Place 2 sprays into both nostrils daily., Disp: 16 g, Rfl: 1 .  montelukast (SINGULAIR) 10 MG tablet, TAKE 1 TABLET (10 MG TOTAL) BY MOUTH AT BEDTIME., Disp: 30 tablet, Rfl: 12 .  gabapentin (NEURONTIN) 100 MG capsule, TAKE 2 CAPSULES (200 MG TOTAL) BY MOUTH AT BEDTIME. (Patient not taking: Reported on 11/03/2018),  Disp: 60 capsule, Rfl: 3  Review of Systems  Constitutional: Negative.   HENT: Negative.   Eyes: Negative.   Respiratory: Negative.   Cardiovascular: Negative.   Gastrointestinal: Negative.   Endocrine: Negative.   Genitourinary: Negative.   Musculoskeletal: Negative.   Skin: Negative.   Allergic/Immunologic: Negative.   Neurological: Negative.   Hematological: Negative.   Psychiatric/Behavioral: Negative.      Social History   Tobacco Use  . Smoking status: Never Smoker  . Smokeless tobacco: Never Used  Substance Use Topics  . Alcohol use: Yes    Alcohol/week: 0.0 standard drinks    Comment: minimal use      Objective:   BP 126/90 (BP Location: Left Arm, Patient Position: Sitting, Cuff Size: Large)   Temp 97.9 F (36.6 C) (Oral)   Ht '6\' 1"'  (1.854 m)   Wt 243 lb (110.2 kg)   BMI 32.06 kg/m  Vitals:   11/03/18 1422  BP: 126/90  Temp: 97.9 F (36.6 C)  TempSrc: Oral  Weight: 243 lb (110.2 kg)  Height: '6\' 1"'  (1.854 m)     Physical Exam   General Appearance:    Alert, cooperative, no distress  Eyes:    PERRL, conjunctiva/corneas clear, EOM's intact       Lungs:     Clear to auscultation bilaterally, respirations unlabored  Heart:    Regular rate and rhythm  Neurologic:   Awake, alert, oriented x 3. No apparent focal neurological           defect. Normal speech and word finding. Normal strength of both upper extremeties          Assessment & Plan    1. 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 Continue ECASA and high intensity statin which he is tolerating well. Follow up neurology as scheduled.   Return in 2 months for labs and CPE    Lelon Huh, MD  [place]

## 2018-11-03 NOTE — Patient Instructions (Addendum)
.   Please review the attached list of medications and notify my office if there are any errors.   . Please bring all of your medications to every appointment so we can make sure that our medication list is the same as yours.    Avoid taking NSAIDs such as ibuprofen and naprosyn. Tylenol (acetaminophen) is fine to take

## 2018-11-12 ENCOUNTER — Other Ambulatory Visit: Payer: Self-pay | Admitting: Family Medicine

## 2018-11-12 DIAGNOSIS — J45909 Unspecified asthma, uncomplicated: Secondary | ICD-10-CM

## 2018-11-12 MED ORDER — EPINEPHRINE 0.3 MG/0.3ML IJ SOAJ: 0.3000 mg | Freq: Once | INTRAMUSCULAR | 3 refills | Status: AC

## 2018-11-12 MED ORDER — ATORVASTATIN CALCIUM 80 MG PO TABS: 80.0000 mg | ORAL_TABLET | Freq: Every day | ORAL | 1 refills | Status: DC

## 2018-11-12 MED ORDER — ALBUTEROL SULFATE HFA 108 (90 BASE) MCG/ACT IN AERS: 2.0000 | INHALATION_SPRAY | Freq: Four times a day (QID) | RESPIRATORY_TRACT | 1 refills | Status: DC | PRN

## 2018-11-12 NOTE — Telephone Encounter (Signed)
Pt needing refills on: atorvastatin (LIPITOR) 80 MG tablet albuterol (PROVENTIL HFA;VENTOLIN HFA) 108 (90 Base) MCG/ACT inhaler EPINEPHrine 0.3 mg/0.3 mL IJ SOAJ injection  Please fill at: [id number] - [place], [place] [phone] (Phone) [phone] (Fax)    Thanks, [employer]

## 2018-11-21 ENCOUNTER — Other Ambulatory Visit: Payer: Self-pay

## 2018-11-21 NOTE — Telephone Encounter (Signed)
Patient calling asking for refill for Lipitor sent to [facility] [place], patient is requesting CMA give him a call back once medication has been approved. KW

## 2018-11-22 MED ORDER — ATORVASTATIN CALCIUM 80 MG PO TABS: 80.0000 mg | ORAL_TABLET | Freq: Every day | ORAL | 2 refills | Status: DC

## 2018-12-16 ENCOUNTER — Encounter: Payer: Self-pay | Admitting: Family Medicine

## 2019-01-11 DIAGNOSIS — G4733 Obstructive sleep apnea (adult) (pediatric): Secondary | ICD-10-CM | POA: Insufficient documentation

## 2019-01-26 ENCOUNTER — Ambulatory Visit (INDEPENDENT_AMBULATORY_CARE_PROVIDER_SITE_OTHER): Payer: Managed Care, Other (non HMO) | Admitting: Family Medicine

## 2019-01-26 ENCOUNTER — Other Ambulatory Visit: Payer: Self-pay

## 2019-01-26 ENCOUNTER — Encounter: Payer: Self-pay | Admitting: Family Medicine

## 2019-01-26 VITALS — BP 110/80 | HR 62 | Temp 98.4°F | Resp 16 | Ht 73.0 in | Wt 226.0 lb

## 2019-01-26 DIAGNOSIS — Z1211 Encounter for screening for malignant neoplasm of colon: Secondary | ICD-10-CM | POA: Diagnosis not present

## 2019-01-26 DIAGNOSIS — Z125 Encounter for screening for malignant neoplasm of prostate: Secondary | ICD-10-CM | POA: Diagnosis not present

## 2019-01-26 DIAGNOSIS — R040 Epistaxis: Secondary | ICD-10-CM

## 2019-01-26 DIAGNOSIS — Z8673 Personal history of transient ischemic attack (TIA), and cerebral infarction without residual deficits: Secondary | ICD-10-CM

## 2019-01-26 DIAGNOSIS — Z23 Encounter for immunization: Secondary | ICD-10-CM | POA: Diagnosis not present

## 2019-01-26 DIAGNOSIS — I679 Cerebrovascular disease, unspecified: Secondary | ICD-10-CM

## 2019-01-26 DIAGNOSIS — Z0000 Encounter for general adult medical examination without abnormal findings: Secondary | ICD-10-CM | POA: Diagnosis not present

## 2019-01-26 NOTE — Patient Instructions (Signed)
.   Please review the attached list of medications and notify my office if there are any errors.   . Please bring all of your medications to every appointment so we can make sure that our medication list is the same as yours.   

## 2019-01-26 NOTE — Progress Notes (Signed)
Patient: Dylan HEMMERICH Sr., Male    DOB: 12-Jan-1956, 63 y.o.   MRN: [id number] Visit Date: 01/26/2019  Today's Provider: Lelon Huh, MD   Chief Complaint  Patient presents with  . Annual Exam  . Cerebrovascular Accident  . Obesity   Subjective:     Annual physical exam Dylan PARCELL Sr. is a 63 y.o. male who presents today for health maintenance and complete physical. He feels fairly well. He reports exercising 2-3 times weekly. He reports he is sleeping fairly well.   -----------------------------------------------------------------  Follow up for CVA:  The patient was last seen for this 3 months ago. Changes made at last visit include none. Patient was to continue follow up with Neurology.  He reports good compliance with treatment. He feels that condition is Improved. He is not having side effects.   ------------------------------------------------------------------------------------  Follow up for Obesity:  The patient was last seen for this 3 months ago. Changes made at last visit include none; patient was counseled to increase exercise.  He reports good compliance with treatment. He feels that condition is Improved. He is not having side effects.   ------------------------------------------------------------------------------------  He has been having nose bleeds every few weeks lately. Has been on ecasa since cva. Not using nasal steroids anymore, but is using fexofenadine consistently.  Review of Systems  Constitutional: Negative for appetite change, chills, fatigue and fever.  HENT: Negative for congestion, ear pain, hearing loss, nosebleeds and trouble swallowing.   Eyes: Negative for pain and visual disturbance.  Respiratory: Negative for cough, chest tightness and shortness of breath.   Cardiovascular: Negative for chest pain, palpitations and leg swelling.  Gastrointestinal: Negative for abdominal pain, blood in stool, constipation, diarrhea,  nausea and vomiting.  Endocrine: Negative for polydipsia, polyphagia and polyuria.  Genitourinary: Negative for dysuria and flank pain.  Musculoskeletal: Negative for arthralgias, back pain, joint swelling, myalgias and neck stiffness.  Skin: Negative for color change, rash and wound.  Neurological: Negative for dizziness, tremors, seizures, speech difficulty, weakness, light-headedness and headaches.  Psychiatric/Behavioral: Negative for behavioral problems, confusion, decreased concentration, dysphoric mood and sleep disturbance. The patient is not nervous/anxious.   All other systems reviewed and are negative.   Social History      He  reports that he has never smoked. He has never used smokeless tobacco. He reports current alcohol use. He reports that he does not use drugs.       Social History   Socioeconomic History  . Marital status: Married    Spouse name: Not on file  . Number of children: 3  . Years of education: Not on file  . Highest education level: Not on file  Occupational History  . Occupation: Manufacturing systems engineer  . Financial resource strain: Not on file  . Food insecurity:    Worry: Not on file    Inability: Not on file  . Transportation needs:    Medical: Not on file    Non-medical: Not on file  Tobacco Use  . Smoking status: Never Smoker  . Smokeless tobacco: Never Used  Substance and Sexual Activity  . Alcohol use: Yes    Alcohol/week: 0.0 standard drinks    Comment: minimal use  . Drug use: No  . Sexual activity: Not on file  Lifestyle  . Physical activity:    Days per week: Not on file    Minutes per session: Not on file  . Stress: Not on file  Relationships  .  Social connections:    Talks on phone: Not on file    Gets together: Not on file    Attends religious service: Not on file    Active member of club or organization: Not on file    Attends meetings of clubs or organizations: Not on file    Relationship status: Not on file  Other  Topics Concern  . Not on file  Social History Narrative  . Not on file    Past Medical History:  Diagnosis Date  . Allergy    seasonal  . Arthritis   . Bulging lumbar disc   . Colon polyp 2017  . Diverticulosis 2017  . History of chicken pox   . Seasonal allergies    HAS ALBUTEROL INHALER TO USE PRN  . Varicose vein      Patient Active Problem List   Diagnosis Date Noted  . History of CVA (cerebrovascular accident) 10/18/2018  . Dyshidrotic eczema 11/08/2015  . 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 Asymptomatic varicose veins of lower extremity 11/04/2015  . Palpitations 11/04/2015  . Right lumbar radiculopathy 11/03/2015  . Arthritis of right knee 10/06/2015    Past Surgical History:  Procedure Laterality Date  . COLONOSCOPY  2017  . HERNIA REPAIR Right 1979   Inguinal hernia repair  . UMBILICAL HERNIA REPAIR N/A 09/11/2016   Procedure: HERNIA REPAIR UMBILICAL ADULT with mesh;  Surgeon: [name], MD;  Location: [facility] ORS;  Service: General;  Laterality: N/A;    Family History        Family Status  Relation Name Status  . Other gen. family hx Alive  . Mother  Deceased       Respiratory problems  . Father  Alive  . Neg Hx  (Not Specified)        His family history includes Stroke in an other family member. There is no history of Cancer or Heart disease.      No Known Allergies   Current Outpatient Medications:  .  albuterol (PROVENTIL HFA;VENTOLIN HFA) 108 (90 Base) MCG/ACT inhaler, Inhale 2 puffs into the lungs every 6 (six) hours as needed for wheezing or shortness of breath., Disp: 1 Inhaler, Rfl: 1 .  aspirin EC 81 MG tablet, Take 81 mg by mouth daily., Disp: , Rfl:  .  atorvastatin (LIPITOR) 80 MG tablet, Take 1 tablet (80 mg total) by mouth daily., Disp: 30 tablet, Rfl: 2 .  Diclofenac Sodium 2 % SOLN, Apply to affected area twice daily (Patient taking differently: Apply 1 g topically daily as needed (pain). Apply to  affected area twice daily), Disp: 1 Bottle, Rfl: 2 .  fexofenadine (ALLEGRA) 180 MG tablet, Take 180 mg by mouth every morning. , Disp: , Rfl:  .  montelukast (SINGULAIR) 10 MG tablet, TAKE 1 TABLET (10 MG TOTAL) BY MOUTH AT BEDTIME., Disp: 30 tablet, Rfl: 12   Patient Care Team: Birdie Sons, MD as PCP - General (Family Medicine) Caryn Section, Linden Dolin, PA-C as Physician Assistant (Physician Assistant) [name], MD (General Surgery) Wynona Canes, MD as Referring Physician (Neurology)    Objective:    Vitals: BP 110/80 (BP Location: Left Arm, Patient Position: Sitting, Cuff Size: Large)   Pulse 62   Temp 98.4 F (36.9 C) (Oral)   Resp 16   Ht 6\' 1"  (1.854 m)   Wt 226 lb (102.5 kg)   SpO2 98% Comment: room air  BMI 29.82 kg/m    Vitals:  01/26/19 1415  BP: 110/80  Pulse: 62  Resp: 16  Temp: 98.4 F (36.9 C)  TempSrc: Oral  SpO2: 98%  Weight: 226 lb (102.5 kg)  Height: 6\' 1"  (1.854 m)     Physical Exam   General Appearance:    Alert, cooperative, no distress, appears stated age  Head:    Normocephalic, without obvious abnormality, atraumatic  Eyes:    PERRL, conjunctiva/corneas clear, EOM's intact, fundi    benign, both eyes       Ears:    Normal TM's and external ear canals, both ears  Nose:   Nares normal, septum midline, mucosa normal, no drainage   or sinus tenderness  Throat:   Lips, mucosa, and tongue normal; teeth and gums normal  Neck:   Supple, symmetrical, trachea midline, no adenopathy;       thyroid:  No enlargement/tenderness/nodules; no carotid   bruit or JVD  Back:     Symmetric, no curvature, ROM normal, no CVA tenderness  Lungs:     Clear to auscultation bilaterally, respirations unlabored  Chest wall:    No tenderness or deformity  Heart:    Regular rate and rhythm, S1 and S2 normal, no murmur, rub   or gallop  Abdomen:     Soft, non-tender, bowel sounds active all four quadrants,    no masses, no organomegaly  Genitalia:     deferred  Rectal:    deferred  Extremities:   Extremities normal, atraumatic, no cyanosis or edema  Pulses:   2+ and symmetric all extremities  Skin:   Skin color, texture, turgor normal, no rashes or lesions  Lymph nodes:   Cervical, supraclavicular, and axillary nodes normal  Neurologic:   CNII-XII intact. Normal strength, sensation and reflexes      throughout    Depression Screen PHQ 2/9 Scores 01/26/2019 11/03/2018 11/08/2015  PHQ - 2 Score 0 0 0  PHQ- 9 Score - 2 0       Assessment & Plan:     Routine Health Maintenance and Physical Exam  Exercise Activities and Dietary recommendations Goals   None     Immunization History  Administered Date(s) Administered  . Hepatitis B 05/16/2000, 06/13/2000, 11/14/2000  . Influenza-Unspecified 05/27/2018  . Td 03/10/1998  . Tdap 10/23/2010    Health Maintenance  Topic Date Due  . HIV Screening  02/27/1972  . COLONOSCOPY  02/27/2007  . INFLUENZA VACCINE  03/28/2019  . TETANUS/TDAP  10/23/2020  . Hepatitis C Screening  Completed     Discussed health benefits of physical activity, and encouraged him to engage in regular exercise appropriate for his age and condition.    --------------------------------------------------------------------  1. Annual physical exam Doing well, unremarkable exam.   2. History of CVA (cerebrovascular accident) Tolerating statin and ecasa well. No residual deficit.  - Lipid panel 3. Prostate cancer screening - PSA  4. Colon cancer screening He reports colonoscopy done by dr Tiffany Kocher in 2017 at [facility]. Will send for results.   5. Need for shingles vaccine #1 - Varicella-zoster vaccine IM  6. Epistaxis Septum is a bit hyperemic, but no lesions. Advised we could refer to ent for further evaluation if it becomes more problematic.   7. Cerebrovascular disease Asymptomatic. Compliant with medication.  Continue aggressive risk factor modification.   - Comprehensive metabolic panel - Lipid  panel  The entirety of the information documented in the History of Present Illness, Review of Systems and Physical Exam were personally obtained by me. Portions  of this information were initially documented by Meyer Cory, CMA and reviewed by me for thoroughness and accuracy.   Lelon Huh, MD  [facility]

## 2019-01-28 ENCOUNTER — Telehealth: Payer: Self-pay

## 2019-01-28 LAB — COMPREHENSIVE METABOLIC PANEL
ALT: 29 IU/L (ref 0–44)
AST: 24 IU/L (ref 0–40)
Albumin/Globulin Ratio: 1.8 (ref 1.2–2.2)
Albumin: 4.4 g/dL (ref 3.8–4.8)
Alkaline Phosphatase: 107 IU/L (ref 39–117)
BUN/Creatinine Ratio: 16 (ref 10–24)
BUN: 12 mg/dL (ref 8–27)
Bilirubin Total: 0.7 mg/dL (ref 0.0–1.2)
CO2: 22 mmol/L (ref 20–29)
Calcium: 9.2 mg/dL (ref 8.6–10.2)
Chloride: 101 mmol/L (ref 96–106)
Creatinine, Ser: 0.76 mg/dL (ref 0.76–1.27)
GFR calc Af Amer: 114 mL/min/{1.73_m2} (ref >59)
GFR calc non Af Amer: 98 mL/min/{1.73_m2} (ref >59)
Globulin, Total: 2.5 g/dL (ref 1.5–4.5)
Glucose: 89 mg/dL (ref 65–99)
Potassium: 4.6 mmol/L (ref 3.5–5.2)
Sodium: 138 mmol/L (ref 134–144)
Total Protein: 6.9 g/dL (ref 6.0–8.5)

## 2019-01-28 LAB — LIPID PANEL
Chol/HDL Ratio: 2.3 ratio (ref 0.0–5.0)
Cholesterol, Total: 76 mg/dL — ABNORMAL LOW (ref 100–199)
HDL: 33 mg/dL — ABNORMAL LOW (ref >39)
LDL Calculated: 35 mg/dL (ref 0–99)
Triglycerides: 42 mg/dL (ref 0–149)
VLDL Cholesterol Cal: 8 mg/dL (ref 5–40)

## 2019-01-28 LAB — PSA: Prostate Specific Ag, Serum: 0.3 ng/mL (ref 0.0–4.0)

## 2019-01-28 NOTE — Telephone Encounter (Signed)
-----   Message from Birdie Sons, MD sent at 01/28/2019  8:00 AM EDT ----- PSA, blood sugar, kidney functions, electrolytes and cholesterol are all normal. Continue current medications.   Check labs yearly.

## 2019-01-28 NOTE — Telephone Encounter (Signed)
LMTCB 01/28/2019  Thanks,   -Mickel Baas

## 2019-01-28 NOTE — Telephone Encounter (Signed)
Patient advised.

## 2019-02-02 ENCOUNTER — Other Ambulatory Visit: Payer: Self-pay

## 2019-02-02 DIAGNOSIS — Z9109 Other allergy status, other than to drugs and biological substances: Secondary | ICD-10-CM

## 2019-02-02 MED ORDER — MONTELUKAST SODIUM 10 MG PO TABS: 10.0000 mg | ORAL_TABLET | Freq: Every day | ORAL | 12 refills | Status: DC

## 2019-02-09 ENCOUNTER — Telehealth: Payer: Self-pay

## 2019-02-09 NOTE — Telephone Encounter (Signed)
Patient wants to know if medication needs to be adjusted for cholesterol. Patient concerned that total cholesterol is to low.

## 2019-02-09 NOTE — Telephone Encounter (Signed)
Pt advised.   Thanks,   -Byard Carranza  

## 2019-02-09 NOTE — Telephone Encounter (Signed)
To prevent strokes, the lower the better. There are no adverse effects to low cholesterol. He should stay on atorvastatin 80.

## 2019-03-04 ENCOUNTER — Other Ambulatory Visit: Payer: Self-pay | Admitting: Family Medicine

## 2019-03-13 ENCOUNTER — Ambulatory Visit
Admission: RE | Admit: 2019-03-13 | Discharge: 2019-03-13 | Disposition: A | Discharge status: Home / Self Care | Payer: Managed Care, Other (non HMO) | Source: Ambulatory Visit | Attending: Adult Health | Admitting: Adult Health

## 2019-03-13 ENCOUNTER — Other Ambulatory Visit: Payer: Self-pay

## 2019-03-13 ENCOUNTER — Ambulatory Visit: Payer: Managed Care, Other (non HMO) | Admitting: Adult Health

## 2019-03-13 ENCOUNTER — Encounter: Payer: Self-pay | Admitting: Adult Health

## 2019-03-13 VITALS — BP 114/68 | HR 77 | Temp 98.4°F | Resp 18

## 2019-03-13 DIAGNOSIS — S99911A Unspecified injury of right ankle, initial encounter: Secondary | ICD-10-CM | POA: Diagnosis present

## 2019-03-13 NOTE — Patient Instructions (Signed)
Go to [facility] if any symptoms change or worsen. Emergency room as discussed for any RED FLAG symptoms as we discussed such as any worsening symptoms including but not limited to increasing pain, swelling, color change, increased warmth or any decrease in feeling.   Advised patient call the office or your primary care doctor for an appointment if no improvement within 72 hours or if any symptoms change or worsen at any time  Advised ER or urgent Care if after hours or on weekend. Call 911 for emergency symptoms at any time.Patinet verbalized understanding of all instructions given/reviewed and treatment plan and has no further questions or concerns at this time.      Elastic Bandage and RICE Therapy  Elastic bandages come in different shapes and sizes. They generally provide support to your injury and reduce swelling while you are healing, but they can perform different functions. Your health care provider will help you to decide what is best for your protection, recovery, or rehabilitation after an injury. The routine care of many injuries includes rest, ice, compression, and elevation (RICE therapy). RICE therapy is often recommended for injuries to soft tissues, such as muscle strain, sprains, bruises, and overuse injuries. It can also be used for some bone injuries. Using RICE therapy can help to relieve pain and lessen swelling. What are some general tips for using an elastic bandage?  Use the bandage as directed by the maker of the bandage that you are using.  Do not wrap the bandage too tightly. This may block (cut off) the circulation in the arm or leg in the area below the bandage. ? If part of your body beyond the bandage becomes blue, numb, cold, swollen, or more painful, your bandage is probably too tight. If this occurs, remove your bandage and reapply it more loosely.  Remove and reapply an elastic bandage every 3-4 hours or as told by your health care provider.  See  your health care provider if the bandage seems to be making your problems worse rather than better. How to care for your injury with RICE therapy Rest Rest your injury. This may help with the healing process. Rest usually involves limiting your normal activities and not using the injured part of your body. Generally, you can return to your normal activities when your health care provider says it is okay and you can do them without much discomfort. If you rest the injury too much, it may not heal as well. Some injuries heal better with early movement instead of resting for too long. Talk with your health care provider about how you should limit your activities and whether you should start range-of-motion exercises for your injury. Ice Ice your injury to lessen swelling and pain. Do not apply ice directly to your skin.  Put ice in a plastic bag.  Place a towel between your skin and the bag.  Leave the ice on for 20 minutes, 2-3 times a day. Use ice on as many days as told by your health care provider.  Compression Put pressure (compression) on your injured area to control swelling, give support, and help with discomfort. Compression may be done with an elastic bandage. Elevation Raise (elevate) your injured area to lessen swelling and pain. If possible, elevate your injured area at or above the level of your heart or the center of your chest. Contact a health care provider if:  Your pain and swelling continue.  Your symptoms are getting worse rather than improving. Having  these problems may mean that you need further evaluation or imaging tests, such as X-rays or an MRI. Sometimes, X-rays may not show a small broken bone (fracture) until days after the injury happened. Make a follow-up appointment with your health care provider. Ask your health care provider, or the department that is doing the imaging test, when your results will be ready. Get help right away if:  You have sudden severe pain  at or below the area of your injury.  You have redness or increased swelling around your injury.  You have tingling or numbness at or below the area of your injury and it does not improve after you remove the elastic bandage. Summary  Elastic bandages provide support to your injury and reduce swelling while you are healing. Your health care provider will help you decide which type of elastic bandage is best for your injury.  Do not wrap the bandage too tightly. This may block (cut off) the circulation in the arm or leg in the area below the bandage.  Putting pressure (compression) on your injured area with an elastic bandage is part of RICE therapy. RICE therapy includes rest, ice, compression, and elevation. This treatment is recommended for the routine care of many injuries. This information is not intended to replace advice given to you by your health care provider. Make sure you discuss any questions you have with your health care provider. Document Released: 02/02/2002 Document Revised: 05/03/2017 Document Reviewed: 05/03/2017 Elsevier Patient Education  2020 [place] for Routine Care of Injuries Many injuries can be cared for with rest, ice, compression, and elevation (RICE therapy). This includes:  Resting the injured part.  Putting ice on the injury.  Putting pressure (compression) on the injury.  Raising the injured part (elevation). Using RICE therapy can help to lessen pain and swelling. Supplies needed:  Ice.  Plastic bag.  Towel.  Elastic bandage.  Pillow or pillows to raise (elevate) your injured body part. How to care for your injury with RICE therapy Rest Limit your normal activities, and try not to use the injured part of your body. You can go back to your normal activities when your doctor says it is okay to do them and you feel okay. Ask your doctor if you should do exercises to help your injury get better. Ice Put ice on the injured  area. Do not put ice on your bare skin.  Put ice in a plastic bag.  Place a towel between your skin and the bag.  Leave the ice on for 20 minutes, 2-3 times a day. Use ice on as many days as told by your doctor.  Compression Compression means putting pressure on the injured area. This can be done with an elastic bandage. If an elastic bandage has been put on your injury:  Do not wrap the bandage too tight. Wrap the bandage more loosely if part of your body away from the bandage is blue, swollen, cold, painful, or loses feeling (gets numb).  Take off the bandage and put it on again. Do this every 3-4 hours or as told by your doctor.  See your doctor if the bandage seems to make your problems worse.  Elevation Elevation means keeping the injured area raised. If you can, raise the injured area above your heart or the center of your chest. Contact a doctor if:  You keep having pain and swelling.  Your symptoms get worse. Get help right away if:  You have  sudden bad pain at your injury or lower than your injury.  You have redness or more swelling around your injury.  You have tingling or numbness at your injury or lower than your injury, and it does not go away when you take off the bandage. Summary  Many injuries can be cared for using rest, ice, compression, and elevation (RICE therapy).  You can go back to your normal activities when you feel okay and your doctor says it is okay.  Put ice on the injured area as told by your doctor.  Get help if your symptoms get worse or if you keep having pain and swelling. This information is not intended to replace advice given to you by your health care provider. Make sure you discuss any questions you have with your health care provider. Document Released: 01/30/2008 Document Revised: 05/03/2017 Document Reviewed: 05/03/2017 Elsevier Patient Education  Person. Ankle Sprain/ INJURY  An ankle sprain is a stretch or tear in a  ligament in the ankle. Ligaments are tissues that connect bones to each other. The two most common types of ankle sprains are:  Inversion sprain. This happens when the foot turns inward and the ankle rolls outward. It affects the ligament on the outside of the foot (lateral ligament).  Eversion sprain. This happens when the foot turns outward and the ankle rolls inward. It affects the ligament on the inner side of the foot (medial ligament). What are the causes? This condition is often caused by accidentally rolling or twisting the ankle. What increases the risk? You are more likely to develop this condition if you play sports. What are the signs or symptoms? Symptoms of this condition include:  Pain in your ankle.  Swelling.  Bruising. This may develop right after you sprain your ankle or 1-2 days later.  Trouble standing or walking, especially when you turn or change directions. How is this diagnosed? This condition is diagnosed with:  A physical exam. During the exam, your health care provider will press on certain parts of your foot and ankle and try to move them in certain ways.  X-ray imaging. These may be taken to see how severe the sprain is and to check for broken bones. How is this treated? This condition may be treated with:  A brace or splint. This is used to keep the ankle from moving until it heals.  An elastic bandage. This is used to support the ankle.  Crutches.  Pain medicine.  Surgery. This may be needed if the sprain is severe.  Physical therapy. This may help to improve the range of motion in the ankle. Follow these instructions at home: If you have a brace or a splint:  Wear the brace or splint as told by your health care provider. Remove it only as told by your health care provider.  Loosen the brace or splint if your toes tingle, become numb, or turn cold and blue.  Keep the brace or splint clean.  If the brace or splint is not waterproof: ?  Do not let it get wet. ? Cover it with a watertight covering when you take a bath or a shower. If you have an elastic bandage (dressing):  Remove it to shower or bathe.  Try not to move your ankle much, but wiggle your toes from time to time. This helps to prevent swelling.  Adjust the dressing to make it more comfortable if it feels too tight.  Loosen the dressing if you have  numbness or tingling in your foot, or if your foot becomes cold and blue. Managing pain, stiffness, and swelling   Take over-the-counter and prescription medicines only as told by your health care provider.  For 2-3 days, keep your ankle raised (elevated) above the level of your heart as much as possible.  If directed, put ice on the injured area: ? If you have a removable brace or splint, remove it as told by your health care provider. ? Put ice in a plastic bag. ? Place a towel between your skin and the bag. ? Leave the ice on for 20 minutes, 2-3 times a day. General instructions  Rest your ankle.  Do not use the injured limb to support your body weight until your health care provider says that you can. Use crutches as told by your health care provider.  Do not use any products that contain nicotine or tobacco, such as cigarettes, e-cigarettes, and chewing tobacco. If you need help quitting, ask your health care provider.  Keep all follow-up visits as told by your health care provider. This is important. Contact a health care provider if:  You have rapidly increasing bruising or swelling.  Your pain is not relieved with medicine. Get help right away if:  Your foot or toes become numb or blue.  You have severe pain that gets worse. Summary  An ankle sprain is a stretch or tear in a ligament in the ankle. Ligaments are tissues that connect bones to each other.  This condition is often caused by accidentally rolling or twisting the ankle.  Symptoms include pain, swelling, bruising, and trouble  walking.  To relieve pain and swelling, put ice on the affected ankle, raise your ankle above the level of your heart, and use an elastic bandage.  Keep all follow-up visits as told by your health care provider. This is important. This information is not intended to replace advice given to you by your health care provider. Make sure you discuss any questions you have with your health care provider. Document Released: 08/13/2005 Document Revised: 05/05/2018 Document Reviewed: 01/07/2018 Elsevier Patient Education  2020 [employer].

## 2019-03-13 NOTE — Progress Notes (Signed)
Soft tissue swelling seen on right ankle laterally where your injury occurred. Heel spur also noted on the x ray here as well. Follow instructions sent in last My Chart note and as we discussed. Apply a compressive ACE bandage. Rest and elevate the affected painful area.  Apply cold compresses intermittently as needed.  As pain recedes, begin normal activities slowly as tolerated.  Call if symptoms persist. See [facility] if symptoms change or worsen. Go to emergency room for emergent symptoms.

## 2019-03-13 NOTE — Progress Notes (Addendum)
Subjective:     Patient ID: Dylan Clinton Sr., male   DOB: July 15, 1956, 63 y.o.   MRN: [id number]  HPI   SUBJECTIVE: Dylan ALLSTON Sr. is a 63 y.o. male who complains of hitting his right  ankle very hard on a trailer hitch that he knew was there but hit it anyway located on the  back of his truck  Onset:  right ankle injury occurred on 03/03/19 last Tuesday. He reports he hit this area very hard against the hitch . Immediate symptoms: immediate pain, immediate swelling, was able to bear weight directly after injury, no deformity was noted by the patient. Symptoms have been insidious since that time. Prior history of related problems: no prior problems with this area in the past. There is pain and swelling at the lateral aspect of that ankle.   He reports he has been working on this ankle with boots on since last Tuesday when injury occurred, He has not rested, iced, compressed or elevated foot. He says swelling is down some and pain is not notiociable upon waking in the morning, this returns after he has been on his feet for over 8 hours.   He denies any loss of consciousness, falls, or other trauma. Denies abuse. Denies any calf pain, shortness of breath.  Patient  denies any fever, body aches,chills, rash, chest pain, shortness of breath, nausea, vomiting, or diarrhea.   He has been on Aspirin since his CVA in February.     OBJECTIVE: He appears well, vital signs are normal. There is swelling and tenderness over the lateral malleolus. No significant warmth, mild bruising. Mild erythema at site of injury otherwise skin is normal color.Right dorsalis pedis pulse 2 + bilaterally and posterior tibia pulse 2+ bilaterally. No tenderness over the medial aspect of the ankle. The fifth metatarsal is not tender. The ankle joint is intact without excessive opening on stressing.  Mild limp with gait not wanting to put full weight on right ankle due to swelling/ pain. He can stand on his ankle with both  feet on the ground. X-ray: ordered, but results not yet available, no fracture or dislocation noted, soft tissue swelling The rest of the foot, ankle and leg exam is normal. Sensation of right foot normal.  4/10 on pain scale with walking and 0/10 with resting per patient/ right ankle.   ASSESSMENT: Ankle  sprain and contusion  PLAN: rest the injured area as much as practical, apply ice packs, elevate the injured limb, compressive bandage See orders for this visit as documented in the electronic medical record.  He does have a history of CVA  10/18/18 he reports he has recovered well, he was found to be in atrial fibrillation after his CVA. He is awaiting a placement of Internal defibrillator/ monitor. He is seeing cardiology and primary care regularly he reports. No vascular compromise of lower extremity foot noted on exam however RED flags were throughout discussed with him and when to seek emergency medical care.  Known injury to right ankle/ trauma.   He is given handout for [facility] and their walk in times.    Tylenol for pain per package instructions.   Orders Placed This Encounter  Procedures  . DG Ankle Complete Right  . DG Foot Complete Right   Follow up with your primary care or [facility]. I will call you with x ray results.  Apply a compressive ACE bandage. Rest and elevate the affected painful area.  Apply cold compresses  intermittently as needed.  As pain recedes, begin normal activities slowly as tolerated.  Call if symptoms persist. ACE bandage given to patient.  Offered work note for patient to be off foot for 3 days -patient declined.   Advised patient call the office or your primary care doctor for an appointment if no improvement within 72 hours or if any symptoms change or worsen at any time  Advised ER or urgent Care if after hours or on weekend. Call 911 for emergency symptoms at any time.Patinet verbalized understanding of all instructions  given/reviewed and treatment plan and has no further questions or concerns at this time.

## 2019-03-30 ENCOUNTER — Other Ambulatory Visit: Payer: Self-pay

## 2019-03-30 ENCOUNTER — Ambulatory Visit (INDEPENDENT_AMBULATORY_CARE_PROVIDER_SITE_OTHER): Payer: Managed Care, Other (non HMO) | Admitting: Family Medicine

## 2019-03-30 DIAGNOSIS — Z23 Encounter for immunization: Secondary | ICD-10-CM | POA: Diagnosis not present

## 2019-04-13 NOTE — Patient Instructions (Signed)
.   Please review the attached list of medications and notify my office if there are any errors.   . Please bring all of your medications to every appointment so we can make sure that our medication list is the same as yours.   . We will have flu vaccines available after Labor Day. Please go to your pharmacy or call the office in early September to schedule you flu shot.   

## 2019-04-13 NOTE — Progress Notes (Signed)
Vaccine administration only. No E&M service today.   

## 2020-01-07 ENCOUNTER — Other Ambulatory Visit: Payer: Self-pay | Admitting: Family Medicine

## 2020-06-02 ENCOUNTER — Other Ambulatory Visit: Payer: Self-pay

## 2020-06-02 ENCOUNTER — Ambulatory Visit: Payer: Managed Care, Other (non HMO) | Admitting: Physician Assistant

## 2020-06-02 ENCOUNTER — Encounter: Payer: Self-pay | Admitting: Physician Assistant

## 2020-06-02 VITALS — BP 116/76 | HR 60 | Temp 97.8°F | Resp 18 | Ht 73.0 in | Wt 222.0 lb

## 2020-06-02 DIAGNOSIS — Z0000 Encounter for general adult medical examination without abnormal findings: Secondary | ICD-10-CM | POA: Diagnosis not present

## 2020-06-02 DIAGNOSIS — Z008 Encounter for other general examination: Secondary | ICD-10-CM | POA: Diagnosis not present

## 2020-06-02 NOTE — Progress Notes (Signed)
Subjective:    Patient ID: Dylan Clinton Sr., male    DOB: [birth year], 64 y.o.   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 Likes lower better  Has had Covid 1 &2 vaccine Flu shot  Had mild stroke 1 1/2 years ago as reported by patient [facility]aged at [facility] Denies any lingering physical issues except mild memory changes, more aware when tired  Umbilical hernia repair 0923 Right inguinal hernia repair age 70  Does do DDS visit every 6 months  OSA Seasonal allergies  PCP Dr Caryn Section at [place]  Not taking Montelucast- patient d/c after reading side effects on drug insert from pharmacy- encouraged to report change to PCP Uses albuterol inhaler very rarely    Objective:   Physical Exam Vitals and nursing note reviewed.  Constitutional:      Appearance: Normal appearance.     Comments: Overweight   6 '1"    222  HENT:     Head: Normocephalic and atraumatic.     Right Ear: Tympanic membrane, ear canal and external ear normal.     Left Ear: Tympanic membrane, ear canal and external ear normal.     Ears:     Comments: Uses Q tips-discussed    Nose: Nose normal.     Mouth/Throat:     Mouth: Mucous membranes are moist.     Comments: DDS q 6 mos Eyes:     Extraocular Movements: Extraocular movements intact.     Conjunctiva/sclera: Conjunctivae normal.  Cardiovascular:     Rate and Rhythm: Normal rate and regular rhythm.     Pulses: Normal pulses.     Heart sounds: Normal heart sounds.  Pulmonary:     Effort: Pulmonary effort is normal.     Breath sounds: Normal breath sounds.  Abdominal:     General: Bowel sounds are normal.     Palpations: Abdomen is soft.     Hernia: No hernia  is present.     Comments: Hx hernia repair x2-- right inguinal; umbilical No tenderness or weakness identified  Genitourinary:    Comments: Denies concerns, exam defered Musculoskeletal:        General: Normal range of motion.     Cervical back: Normal range of motion.  Lymphadenopathy:     Cervical: No cervical adenopathy.  Skin:    General: Skin is warm and dry.     Capillary Refill: Capillary refill takes less than 2 seconds.  Neurological:     General: No focal deficit present.     Mental Status: He is alert.     Cranial Nerves: No cranial nerve deficit.     Deep Tendon Reflexes: Reflexes normal.  Psychiatric:        Mood and Affect: Mood normal.        Behavior: Behavior normal.       Assessment & Plan:  Reviewed recommendation for 30 minute brisk walk daily Encouraged to do during work hours on breaks for added motivation to get it done daily "required" Encouraged goal of 10 -12 pound weight loss- watch food choices, servings/no seconds/ "no white is right" reviewed He brings [place] wraps and yogurt from home- does not  do fast foods- good handle on healthy, may need more focus on walking consistently to burn calories off Reports MyChart as Active ( both [facility] and [facility]) Will report labs as available

## 2020-06-03 LAB — LIPID PANEL
Chol/HDL Ratio: 2.6 ratio (ref 0.0–5.0)
Cholesterol, Total: 102 mg/dL (ref 100–199)
HDL: 40 mg/dL (ref >39)
LDL Chol Calc (NIH): 50 mg/dL (ref 0–99)
Triglycerides: 51 mg/dL (ref 0–149)
VLDL Cholesterol Cal: 12 mg/dL (ref 5–40)

## 2020-06-03 LAB — GLUCOSE, RANDOM: Glucose: 90 mg/dL (ref 65–99)

## 2020-07-04 ENCOUNTER — Other Ambulatory Visit: Payer: Self-pay | Admitting: Family Medicine

## 2020-07-04 NOTE — Telephone Encounter (Signed)
Requested Prescriptions  Pending Prescriptions Disp Refills  . atorvastatin (LIPITOR) 80 MG tablet [Pharmacy Med Name: ATORVASTATIN 80 MG TABLET] 90 tablet 0    Sig: TAKE 1 TABLET BY MOUTH EVERY DAY     Cardiovascular:  Antilipid - Statins Failed - 07/04/2020  1:34 AM      Failed - LDL in normal range and within 360 days    LDL Chol Calc (NIH)  Date Value Ref Range Status  06/02/2020 50 0 - 99 mg/dL Final         Failed - Valid encounter within last 12 months    Recent Outpatient Visits          1 year ago Need for shingles vaccine   [facility] Birdie Sons, MD   1 year ago Annual physical exam   [facility] Birdie Sons, MD   1 year ago 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 [facility])   [facility] Birdie Sons, MD   4 years ago Annual physical exam   [place], MD             Passed - Total Cholesterol in normal range and within 360 days    Cholesterol, Total  Date Value Ref Range Status  06/02/2020 102 100 - 199 mg/dL Final         Passed - HDL in normal range and within 360 days    HDL  Date Value Ref Range Status  06/02/2020 40 >39 mg/dL Final         Passed - Triglycerides in normal range and within 360 days    Triglycerides  Date Value Ref Range Status  06/02/2020 51 0 - 149 mg/dL Final         Passed - Patient is not pregnant

## 2020-07-14 ENCOUNTER — Other Ambulatory Visit: Payer: Self-pay

## 2020-07-14 ENCOUNTER — Ambulatory Visit: Payer: Managed Care, Other (non HMO)

## 2020-07-14 DIAGNOSIS — Z23 Encounter for immunization: Secondary | ICD-10-CM

## 2020-08-05 IMAGING — CR RIGHT ANKLE - COMPLETE 3+ VIEW
1 series · 3 of 3 positions shown · non-contrast
Comparison: None.

CLINICAL DATA: Right ankle injury last week

EXAM:
RIGHT ANKLE - COMPLETE 3+ VIEW

[Series 1: dg ankle complete right · 0.14mm/px · 3 of 3 slices shown]
[im 1/3]
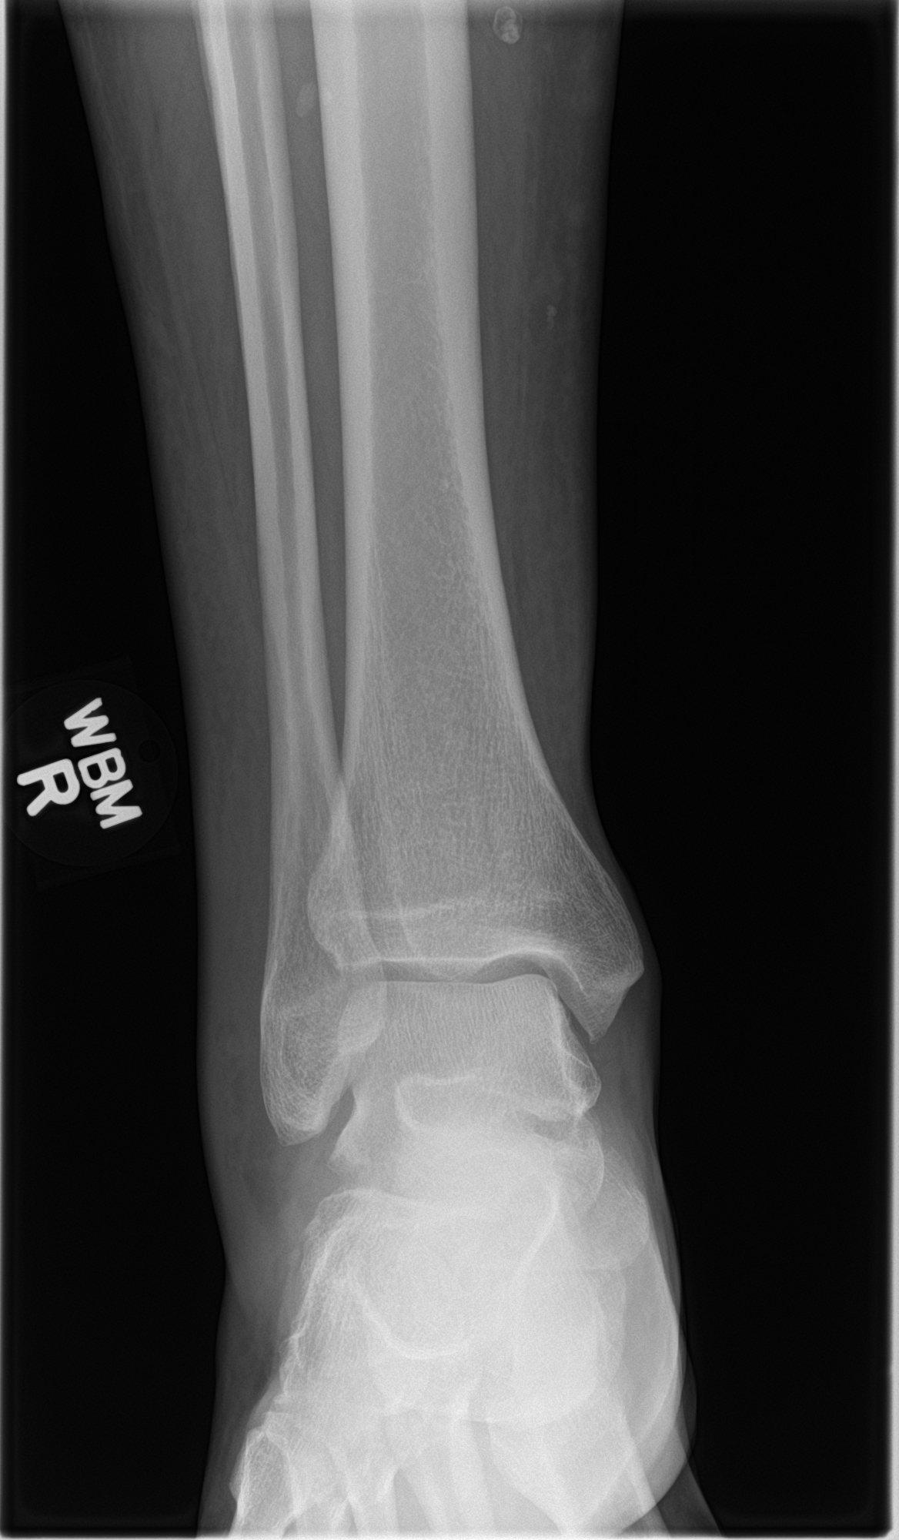
[im 2/3]
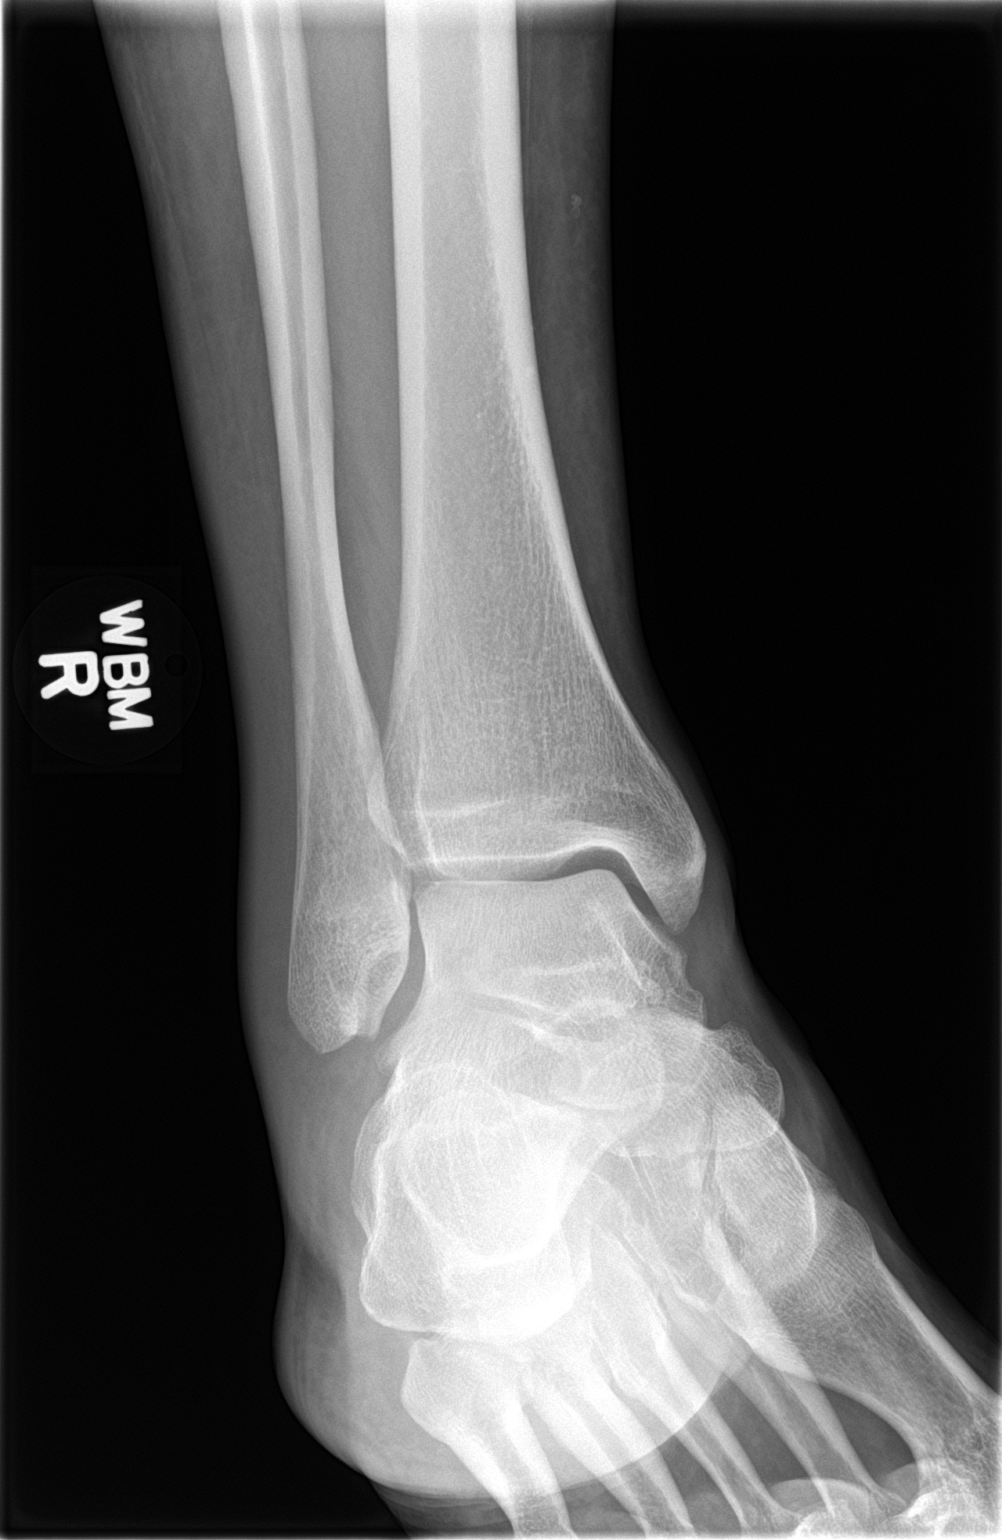
[im 3/3]
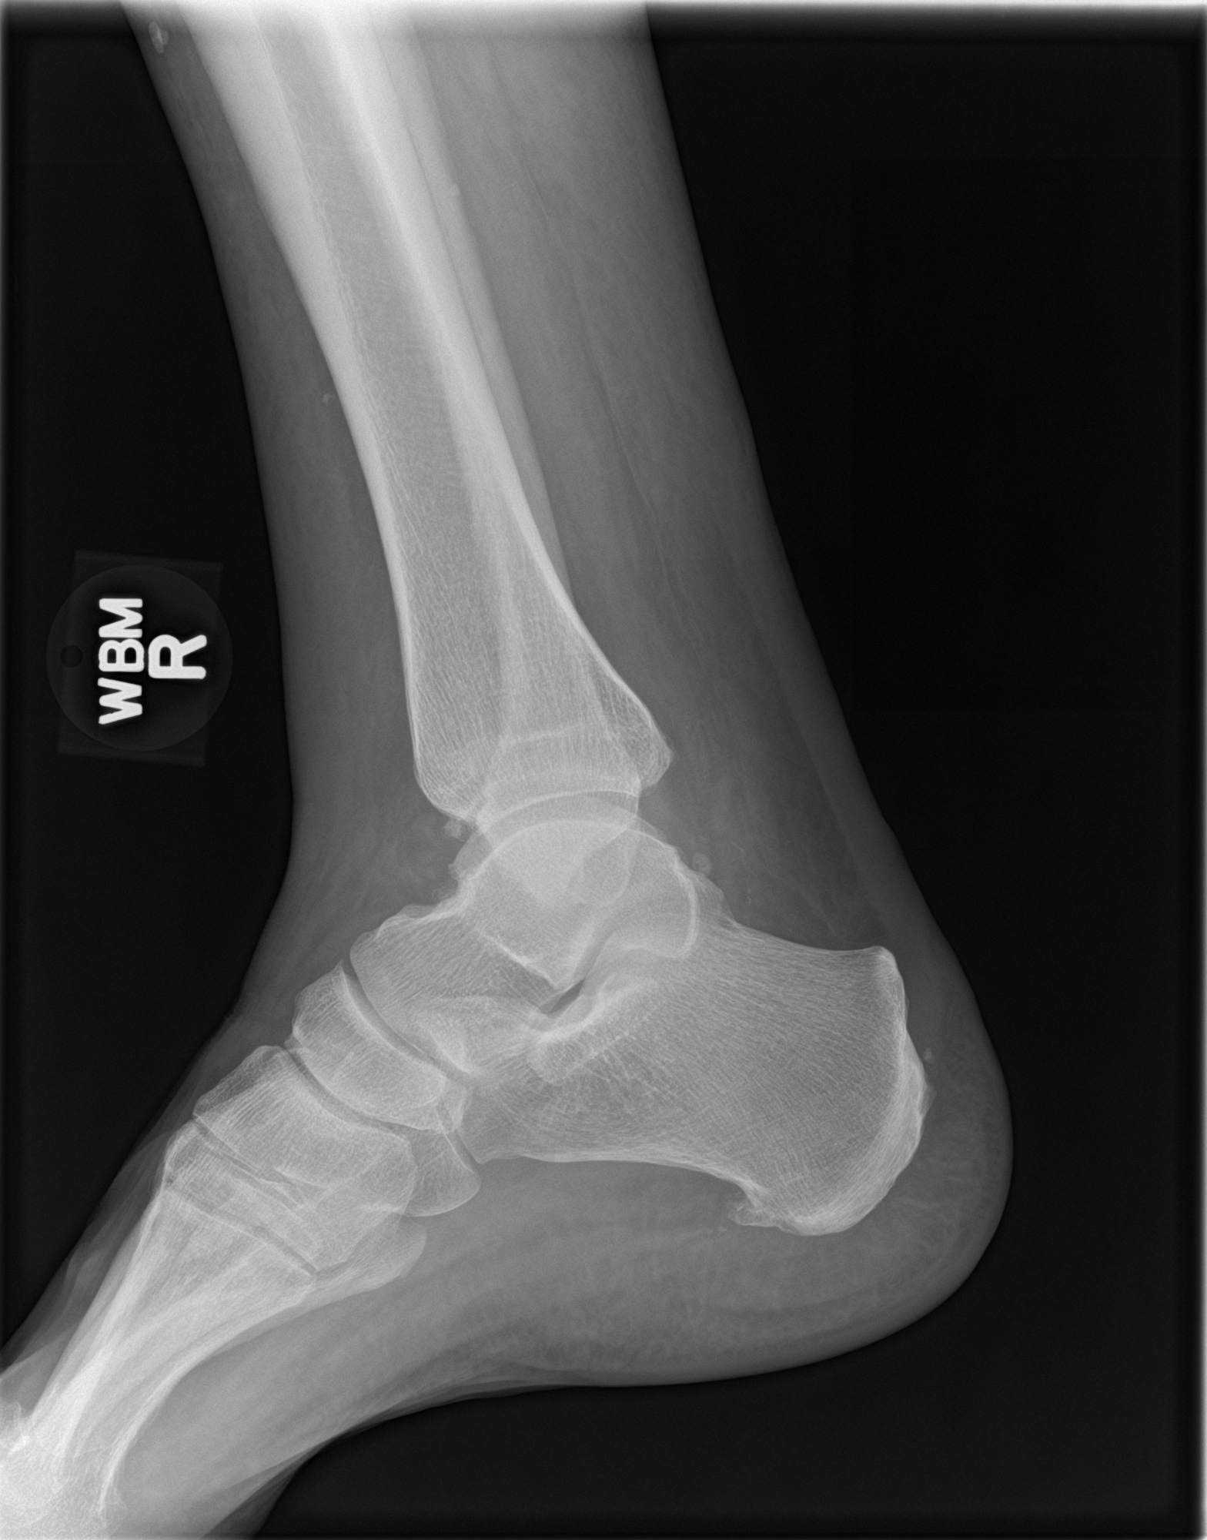

[3 of 3 positions shown; findings below may reference images not displayed]

FINDINGS: Negative for fracture. Soft tissue swelling laterally. Mild
calcaneal spurring.
IMPRESSION: Negative for fracture.  Lateral soft tissue swelling.

## 2020-09-25 ENCOUNTER — Other Ambulatory Visit: Payer: Self-pay | Admitting: Family Medicine

## 2020-09-25 NOTE — Telephone Encounter (Signed)
Requested medications are due for refill today.  yes  Requested medications are on the active medications list.  yes  Last refill. 07/04/2020  Future visit scheduled.   no  Notes to clinic.  Courtesy refill already given - pt needs appt.

## 2020-10-06 ENCOUNTER — Other Ambulatory Visit: Payer: Self-pay | Admitting: Family Medicine

## 2020-10-06 MED ORDER — ATORVASTATIN CALCIUM 80 MG PO TABS: 80.0000 mg | ORAL_TABLET | Freq: Every day | ORAL | 1 refills | Status: DC

## 2020-10-06 NOTE — Telephone Encounter (Signed)
Medication: atorvastatin (LIPITOR) 80 MG tablet [[id number]] -Apt 11/30/20  Has the patient contacted their pharmacy? YES  (Agent: If no, request that the patient contact the pharmacy for the refill.) (Agent: If yes, when and what did the pharmacy advise?)  Preferred Pharmacy (with phone number or street name): [id number] [place], [address] [place] [address] Phone: [phone] Fax: [phone] Hours: Not open 24 hours    Agent: Please be advised that RX refills may take up to 3 business days. We ask that you follow-up with your pharmacy.

## 2020-10-19 ENCOUNTER — Other Ambulatory Visit: Payer: Self-pay | Admitting: Family Medicine

## 2020-11-21 ENCOUNTER — Other Ambulatory Visit: Payer: Self-pay | Admitting: Family Medicine

## 2020-11-21 NOTE — Telephone Encounter (Signed)
   Notes to clinic:  Patient is schedule for 11/30/2020 He will run out of medication before appt  Courtesy refill has already been given  Please advise    Requested Prescriptions  Pending Prescriptions Disp Refills   atorvastatin (LIPITOR) 80 MG tablet [Pharmacy Med Name: ATORVASTATIN 80 MG TABLET] 30 tablet 1    Sig: TAKE 1 TABLET BY MOUTH EVERY DAY      Cardiovascular:  Antilipid - Statins Failed - 11/21/2020 12:32 PM      Failed - LDL in normal range and within 360 days    LDL Chol Calc (NIH)  Date Value Ref Range Status  06/02/2020 50 0 - 99 mg/dL Final          Failed - Valid encounter within last 12 months    Recent Outpatient Visits           1 year ago Need for shingles vaccine   [facility] Birdie Sons, MD   1 year ago Annual physical exam   [facility] Birdie Sons, MD   2 years ago 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 [facility])   [facility] Birdie Sons, MD   5 years ago Annual physical exam   [place], MD       Future Appointments             In 1 week Fisher, Kirstie Peri, MD [facility]             Passed - Total Cholesterol in normal range and within 360 days    Cholesterol, Total  Date Value Ref Range Status  06/02/2020 102 100 - 199 mg/dL Final          Passed - HDL in normal range and within 360 days    HDL  Date Value Ref Range Status  06/02/2020 40 >39 mg/dL Final          Passed - Triglycerides in normal range and within 360 days    Triglycerides  Date Value Ref Range Status  06/02/2020 51 0 - 149 mg/dL Final          Passed - Patient is not pregnant

## 2020-11-30 ENCOUNTER — Other Ambulatory Visit: Payer: Self-pay

## 2020-11-30 ENCOUNTER — Encounter: Payer: Self-pay | Admitting: Family Medicine

## 2020-11-30 ENCOUNTER — Ambulatory Visit (INDEPENDENT_AMBULATORY_CARE_PROVIDER_SITE_OTHER): Payer: Managed Care, Other (non HMO) | Admitting: Family Medicine

## 2020-11-30 VITALS — BP 121/78 | HR 68 | Temp 98.4°F | Resp 16 | Ht 73.0 in | Wt 234.8 lb

## 2020-11-30 DIAGNOSIS — K1379 Other lesions of oral mucosa: Secondary | ICD-10-CM | POA: Diagnosis not present

## 2020-11-30 DIAGNOSIS — Z23 Encounter for immunization: Secondary | ICD-10-CM | POA: Diagnosis not present

## 2020-11-30 DIAGNOSIS — Z9989 Dependence on other enabling machines and devices: Secondary | ICD-10-CM

## 2020-11-30 DIAGNOSIS — Z8673 Personal history of transient ischemic attack (TIA), and cerebral infarction without residual deficits: Secondary | ICD-10-CM

## 2020-11-30 DIAGNOSIS — Z125 Encounter for screening for malignant neoplasm of prostate: Secondary | ICD-10-CM

## 2020-11-30 DIAGNOSIS — Z0000 Encounter for general adult medical examination without abnormal findings: Secondary | ICD-10-CM | POA: Diagnosis not present

## 2020-11-30 DIAGNOSIS — G4733 Obstructive sleep apnea (adult) (pediatric): Secondary | ICD-10-CM

## 2020-11-30 DIAGNOSIS — Z86010 Personal history of colonic polyps: Secondary | ICD-10-CM

## 2020-11-30 NOTE — Patient Instructions (Signed)
.   Please review the attached list of medications and notify my office if there are any errors.   . Please bring all of your medications to every appointment so we can make sure that our medication list is the same as yours.   

## 2020-11-30 NOTE — Progress Notes (Signed)
Complete physical exam   Patient: Dylan MARTUCCI Sr.   DOB: 03-08-1956   65 y.o. Male  MRN: [id number] Visit Date: 11/30/2020  Today's healthcare provider: Lelon Huh, MD   Chief Complaint  Patient presents with  . Annual Exam   Subjective    Dylan MARINOS Sr. is a 65 y.o. male who presents today for a complete physical exam.  He reports consuming a general diet. Home exercise routine includes walking and sit ups. He generally feels fairly well. He reports sleeping fairly well. He does have additional problems to discuss today.   01/10/2016 HM COLONOSCOPY: Patient reported  HPI  History of stroke/ Lipid/Cholesterol, Follow-up  Last lipid panel Other pertinent labs  Lab Results  Component Value Date   CHOL 102 06/02/2020   HDL 40 06/02/2020   LDLCALC 50 06/02/2020   TRIG 51 06/02/2020   CHOLHDL 2.6 06/02/2020   Lab Results  Component Value Date   ALT 29 01/27/2019   AST 24 01/27/2019   PLT 267 11/11/2015     He was last seen for this almost 2 years ago.   Management since that visit includes continuing same medication.  He reports good compliance with treatment. He is not having side effects.   Symptoms: No chest pain No chest pressure/discomfort  No dyspnea No lower extremity edema  No numbness or tingling of extremity No orthopnea  No palpitations No paroxysmal nocturnal dyspnea  No speech difficulty No syncope   Current diet: in general, a "healthy" diet   Current exercise: walking  The ASCVD Risk score ([place]., et al., 2013) failed to calculate for the following reasons:   The patient has a prior MI or stroke diagnosis  --------------------------------------------------------------------------------------------------- Oral swelling: Patient complains of swelling and soreness of the lips and tongue. Symptoms started 2 months ago and is unchanged since onset. Patient denies any trouble breathing. Symptoms worsen when drinking warm liquids or  eating salty foods.    Past Medical History:  Diagnosis Date  . Allergy    seasonal  . Arthritis   . Bulging lumbar disc   . Colon polyp 2017  . Diverticulosis 2017  . History of chicken pox   . Seasonal allergies    HAS ALBUTEROL INHALER TO USE PRN  . Varicose vein    Past Surgical History:  Procedure Laterality Date  . COLONOSCOPY  2017  . HERNIA REPAIR Right 1979   Inguinal hernia repair  . UMBILICAL HERNIA REPAIR N/A 09/11/2016   Procedure: HERNIA REPAIR UMBILICAL ADULT with mesh;  Surgeon: [name], MD;  Location: [facility] ORS;  Service: General;  Laterality: N/A;   Social History   Socioeconomic History  . Marital status: Married    Spouse name: Not on file  . Number of children: 3  . Years of education: Not on file  . Highest education level: Not on file  Occupational History  . Occupation: Dealer  Tobacco Use  . Smoking status: Never Smoker  . Smokeless tobacco: Never Used  Substance and Sexual Activity  . Alcohol use: Yes    Alcohol/week: 0.0 standard drinks    Comment: minimal use  . Drug use: No  . Sexual activity: Yes  Other Topics Concern  . Not on file  Social History Narrative  . Not on file   Social Determinants of Health   Financial Resource Strain: Not on file  Food Insecurity: Not on file  Transportation Needs: Not on file  Physical  Activity: Not on file  Stress: Not on file  Social Connections: Not on file  Intimate Partner Violence: Not on file   Family Status  Relation Name Status  . Other gen. family hx Alive  . Mother  Deceased       Respiratory problems  . Father  Alive  . Neg Hx  (Not Specified)   Family History  Problem Relation Age of Onset  . Stroke Other   . Cancer Neg Hx   . Heart disease Neg Hx    No Known Allergies  Patient Care Team: Birdie Sons, MD as PCP - General (Family Medicine) Caryn Section, [place] Dolin, PA-C as Physician Assistant (Physician Assistant) [name], MD (General  Surgery) Wynona Canes, MD as Referring Physician (Neurology)   Medications: Outpatient Medications Prior to Visit  Medication Sig  . albuterol (PROVENTIL HFA;VENTOLIN HFA) 108 (90 Base) MCG/ACT inhaler Inhale 2 puffs into the lungs every 6 (six) hours as needed for wheezing or shortness of breath.  [name] aspirin EC 81 MG tablet Take 81 mg by mouth daily.  [name] atorvastatin (LIPITOR) 80 MG tablet Take 1 tablet (80 mg total) by mouth daily.  . fexofenadine (ALLEGRA) 180 MG tablet Take 180 mg by mouth every morning.   . Diclofenac Sodium 2 % SOLN Apply to affected area twice daily (Patient taking differently: Apply 1 g topically daily as needed (pain). Apply to affected area twice daily)  . EPINEPHrine 0.3 mg/0.3 mL IJ SOAJ injection INJECT 0.3 MLS (0.3 MG TOTAL) INTO THE MUSCLE ONCE FOR 1 DOSE.  . montelukast (SINGULAIR) 10 MG tablet Take 1 tablet (10 mg total) by mouth at bedtime. (Patient not taking: Reported on 11/30/2020)   No facility-administered medications prior to visit.    Review of Systems  Constitutional: Negative for appetite change, chills, fatigue and fever.  HENT: Positive for facial swelling. Negative for congestion, ear pain, hearing loss, nosebleeds and trouble swallowing.        Oral swelling and soreness inside the mouth, lips and tongue  Eyes: Negative for pain and visual disturbance.  Respiratory: Negative for cough, chest tightness and shortness of breath.   Cardiovascular: Negative for chest pain, palpitations and leg swelling.  Gastrointestinal: Negative for abdominal pain, blood in stool, constipation, diarrhea, nausea and vomiting.  Endocrine: Negative for polydipsia, polyphagia and polyuria.  Genitourinary: Negative for dysuria and flank pain.  Musculoskeletal: Negative for arthralgias, back pain, joint swelling, myalgias and neck stiffness.  Skin: Negative for color change, rash and wound.  Neurological: Negative for dizziness, tremors, seizures, speech difficulty,  weakness, light-headedness and headaches.  Psychiatric/Behavioral: Negative for behavioral problems, confusion, decreased concentration, dysphoric mood and sleep disturbance. The patient is not nervous/anxious.   All other systems reviewed and are negative.     Objective    BP 121/78 (BP Location: Left Arm, Patient Position: Sitting, Cuff Size: Large)   Pulse 68   Temp 98.4 F (36.9 C) (Temporal)   Resp 16   Ht 6\' 1"  (1.854 m)   Wt 234 lb 12.8 oz (106.5 kg)   BMI 30.98 kg/m    Physical Exam   General Appearance:    Mildly obese male. Alert, cooperative, in no acute distress, appears stated age  Head:    Normocephalic, without obvious abnormality, atraumatic  Eyes:    PERRL, conjunctiva/corneas clear, EOM's intact, fundi    benign, both eyes       Ears:    Normal TM's and external ear canals, both ears  Oral cavity:   No lesions, swelling or erythema.   Neck:   Supple, symmetrical, trachea midline, no adenopathy;       thyroid:  No enlargement/tenderness/nodules; no carotid   bruit or JVD  Back:     Symmetric, no curvature, ROM normal, no CVA tenderness  Lungs:     Clear to auscultation bilaterally, respirations unlabored  Chest wall:    No tenderness or deformity  Heart:    Normal heart rate. Normal rhythm. No murmurs, rubs, or gallops.  S1 and S2 normal  Abdomen:     Soft, non-tender, bowel sounds active all four quadrants,    no masses, no organomegaly  Genitalia:    deferred  Rectal:    deferred  Extremities:   All extremities are intact. No cyanosis or edema  Pulses:   2+ and symmetric all extremities  Skin:   Skin color, texture, turgor normal, no rashes or lesions  Lymph nodes:   Cervical, supraclavicular, and axillary nodes normal  Neurologic:   CNII-XII intact. Normal strength, sensation and reflexes      throughout     Last depression screening scores PHQ 2/9 Scores 11/30/2020 01/26/2019 11/03/2018  PHQ - 2 Score 0 0 0  PHQ- 9 Score 0 - 2   Last fall risk  screening Fall Risk  11/30/2020  Falls in the past year? 0  Number falls in past yr: 0  Injury with Fall? 0  Follow up Falls evaluation completed   Last Audit-C alcohol use screening Alcohol Use Disorder Test (AUDIT) 11/30/2020  1. How often do you have a drink containing alcohol? 1  2. How many drinks containing alcohol do you have on a typical day when you are drinking? 0  3. How often do you have six or more drinks on one occasion? 0  AUDIT-C Score 1   A score of 3 or more in women, and 4 or more in men indicates increased risk for alcohol abuse, EXCEPT if all of the points are from question 1   No results found for any visits on 11/30/20.  Assessment & Plan    Routine Health Maintenance and Physical Exam  Exercise Activities and Dietary recommendations Goals   None     Immunization History  Administered Date(s) Administered  . Hepatitis B 05/16/2000, 06/13/2000, 11/14/2000  . Influenza,inj,Quad PF,6+ Mos 07/14/2020  . Influenza-Unspecified 05/27/2018  . Moderna Sars-Covid-2 Vaccination 08/27/2019, 09/25/2019  . Td 03/10/1998  . Tdap 10/23/2010  . Zoster Recombinat (Shingrix) 01/26/2019, 03/30/2019    Health Maintenance  Topic Date Due  . HIV Screening  Never done  . COLONOSCOPY (Pts 45-41yrs Insurance coverage will need to be confirmed)  01/10/2019  . COVID-19 Vaccine (3 - Booster for Moderna series) 03/24/2020  . TETANUS/TDAP  10/23/2020  . INFLUENZA VACCINE  03/27/2021  . Hepatitis C Screening  Completed  . HPV VACCINES  Aged Out    Discussed health benefits of physical activity, and encouraged him to engage in regular exercise appropriate for his age and condition.  1. Annual physical exam  - Vitamin B12 - Comprehensive metabolic panel - Lipid panel  2. Oral pain Has been going on for a few months described as a hypersensitivity mostly of inner lips.  - Vitamin B12  3. Prostate cancer screening  - PSA Total (Reflex To Free) ([facility] only)  4. Need  for tetanus, diphtheria, and acellular pertussis (Tdap) vaccine in patient of adolescent age or older He is pretty sure he has had Tetanus  vaccine within the last few years at employee health.   5. Personal history of colonic polyps Last colonoscopy was 2022 and I have no pathology report. Will send for report to determine recommended follow up.   6. History of CVA (cerebrovascular accident) Asymptomatic. Compliant with medication.  Continue aggressive risk factor modification.   - Comprehensive metabolic panel - Lipid panel       The entirety of the information documented in the History of Present Illness, Review of Systems and Physical Exam were personally obtained by me. Portions of this information were initially documented by the CMA and reviewed by me for thoroughness and accuracy.      Lelon Huh, MD  [facility] [phone] (phone) [phone] (fax)  [place]

## 2020-12-02 LAB — COMPREHENSIVE METABOLIC PANEL
ALT: 28 IU/L (ref 0–44)
AST: 27 IU/L (ref 0–40)
Albumin/Globulin Ratio: 1.5 (ref 1.2–2.2)
Albumin: 4.4 g/dL (ref 3.8–4.8)
Alkaline Phosphatase: 104 IU/L (ref 44–121)
BUN/Creatinine Ratio: 16 (ref 10–24)
BUN: 12 mg/dL (ref 8–27)
Bilirubin Total: 0.7 mg/dL (ref 0.0–1.2)
CO2: 22 mmol/L (ref 20–29)
Calcium: 9.4 mg/dL (ref 8.6–10.2)
Chloride: 103 mmol/L (ref 96–106)
Creatinine, Ser: 0.77 mg/dL (ref 0.76–1.27)
Globulin, Total: 3 g/dL (ref 1.5–4.5)
Glucose: 89 mg/dL (ref 65–99)
Potassium: 4.7 mmol/L (ref 3.5–5.2)
Sodium: 138 mmol/L (ref 134–144)
Total Protein: 7.4 g/dL (ref 6.0–8.5)
eGFR: 101 mL/min/{1.73_m2} (ref >59)

## 2020-12-02 LAB — LIPID PANEL
Chol/HDL Ratio: 2.2 ratio (ref 0.0–5.0)
Cholesterol, Total: 93 mg/dL — ABNORMAL LOW (ref 100–199)
HDL: 43 mg/dL (ref >39)
LDL Chol Calc (NIH): 39 mg/dL (ref 0–99)
Triglycerides: 41 mg/dL (ref 0–149)
VLDL Cholesterol Cal: 11 mg/dL (ref 5–40)

## 2020-12-02 LAB — PSA TOTAL (REFLEX TO FREE): Prostate Specific Ag, Serum: 0.4 ng/mL (ref 0.0–4.0)

## 2020-12-02 LAB — VITAMIN B12: Vitamin B-12: 474 pg/mL (ref 232–1245)

## 2020-12-07 ENCOUNTER — Other Ambulatory Visit: Payer: Self-pay | Admitting: Family Medicine

## 2020-12-07 ENCOUNTER — Encounter: Payer: Self-pay | Admitting: Family Medicine

## 2020-12-07 MED ORDER — ATORVASTATIN CALCIUM 80 MG PO TABS: 80.0000 mg | ORAL_TABLET | Freq: Every day | ORAL | 0 refills | Status: DC

## 2020-12-07 NOTE — Telephone Encounter (Signed)
Medication Refill - Medication: Atorvastatin   Has the patient contacted their pharmacy? Yes.   PT states that he contacted pharmacy and they state that pt needs to set up an appt. Pt recently had an appt on 11/30/20. Please advise/  (Agent: If no, request that the patient contact the pharmacy for the refill.) (Agent: If yes, when and what did the pharmacy advise?)  Preferred Pharmacy (with phone number or street name):  [id number] [place], [address]  [place] [address]  Phone: [phone] Fax: [phone]  Hours: Not open 24 hours     Agent: Please be advised that RX refills may take up to 3 business days. We ask that you follow-up with your pharmacy.

## 2021-01-25 NOTE — Progress Notes (Signed)
Subjective:    CC: L knee pain  I, Dylan Crawford, LAT, ATC, am serving as scribe for Dr. Lynne Leader.  HPI: Pt is a 65 y/o male presenting w/ c/o L knee pain x few weeks w/ no known MOI.  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  L knee swelling: yes in his ant knee L knee mechanical symptoms: yes, some popping noted Aggravating factors: standing h/s stretch; climbing stairs; walking up an incline; weight-bearing rotation Treatments tried: ice; elevation; knee brace; diclofenac gel; Aleve  Pertinent review of Systems: No fevers or chills  Relevant historical information: Neuropathy history.  History CVA.   Objective:    Vitals:   01/26/21 0853  BP: 118/78  Pulse: 62  SpO2: 98%   General: Well Developed, well nourished, and in no acute distress.   MSK: Left knee normal-appearing normal motion. Tender palpation medial joint line and posterior aspect of medial joint line. Stable ligamentous exam. Intact strength. Some pain with resisted knee flexion. Negative McMurray's test.  Lab and Radiology Results  Procedure: Real-time Ultrasound Guided Injection of left knee superior lateral patellar space Device: Philips Affiniti 50G Images permanently stored and available for review in PACS Ultrasound evaluation prior to injection reveals degenerative appearing medial meniscus.  No Baker's cyst.  Mild joint effusion. Verbal informed consent obtained.  Discussed risks and benefits of procedure. Warned about infection bleeding damage to structures skin hypopigmentation and fat atrophy among others. Patient expresses understanding and agreement Time-out conducted.   Noted no overlying erythema, induration, or other signs of local infection.   Skin prepped in a sterile fashion.   Local anesthesia: Topical Ethyl chloride.   With sterile technique and under real time ultrasound guidance:  40 mg of Kenalog and 2 mL of  Marcaine injected into knee joint. Fluid seen entering the joint capsule.   Completed without difficulty   Pain immediately resolved suggesting accurate placement of the medication.   Advised to call if fevers/chills, erythema, induration, drainage, or persistent bleeding.   Images permanently stored and available for review in the ultrasound unit.  Impression: Technically successful ultrasound guided injection.        Impression and Recommendations:    Assessment and Plan: 65 y.o. 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  Plan for steroid injection, Pennsaid, compression sleeve.  Recheck in 6 weeks.  Consider x-ray at follow-up.  Recheck if not improved.[name]  PDMP not reviewed this encounter. Orders Placed This Encounter  Procedures  . [place] LIMITED JOINT SPACE STRUCTURES LOW LEFT(NO LINKED CHARGES)    Order Specific Question:   Reason for Exam (SYMPTOM  OR DIAGNOSIS REQUIRED)    Answer:   L knee pain    Order Specific Question:   Preferred imaging location?    Answer:   [place]   Meds ordered this encounter  Medications  . Diclofenac Sodium (PENNSAID) 2 % SOLN    Sig: Place 1 application onto the skin 2 (two) times daily.    Dispense:  112 g    Refill:  2    Home Phone      [phone] Mobile          [phone] Home Phone      [phone]     Discussed warning signs or symptoms. Please see discharge instructions. Patient expresses understanding.  The above documentation has been reviewed and is accurate and complete Lynne Leader, M.D.

## 2021-01-26 ENCOUNTER — Encounter: Payer: Self-pay | Admitting: Family Medicine

## 2021-01-26 ENCOUNTER — Ambulatory Visit: Payer: Self-pay

## 2021-01-26 ENCOUNTER — Other Ambulatory Visit: Payer: Self-pay

## 2021-01-26 ENCOUNTER — Ambulatory Visit: Payer: Managed Care, Other (non HMO) | Admitting: Family Medicine

## 2021-01-26 VITALS — BP 118/78 | HR 62 | Ht 73.0 in | Wt 232.6 lb

## 2021-01-26 DIAGNOSIS — G8929 Other chronic pain: Secondary | ICD-10-CM

## 2021-01-26 DIAGNOSIS — M25562 Pain in left knee: Secondary | ICD-10-CM | POA: Diagnosis not present

## 2021-01-26 MED ORDER — PENNSAID 2 % EX SOLN: 1.0000 "application " | Freq: Two times a day (BID) | CUTANEOUS | 2 refills | Status: AC

## 2021-01-26 NOTE — Patient Instructions (Addendum)
Good to see you today.  You had a L knee injection today.  Call or go to the ER if you develop a large red swollen joint with extreme pain or oozing puss.    Pennsaid instructions: You have been given a sample/prescription for Pennsaid, a topical medication.     You are to apply this gel to your injured body part twice daily (morning and evening).   A little goes a long way so you can use about a pea-sized amount for each area.   Spread this small amount over the area into a thin film and let it dry.   Be sure that you do not rub the gel into your skin for more than 10 or 15 seconds otherwise it can irritate you skin.    Once you apply the gel, please do not put any other lotion or clothing in contact with that area for 30 minutes to allow the gel to absorb into your skin.   Some people are sensitive to the medication and can develop a sunburn-like rash.  If you have only mild symptoms it is okay to continue to use the medication but if you have any breakdown of your skin you should discontinue its use and please let us know.   If you have been written a prescription for Pennsaid, you will receive a pump bottle of this topical gel through a mail order pharmacy.  The instructions on the bottle will say to apply two pumps twice a day which may be too much gel for your particular area so use the pea-sized amount as your guide.   Instructions for Duexis, Pennsaid and Vimovo:  Your prescription will be filled through a participating [facility] mail order pharmacy.  You will receive a phone call or text from one of the participating pharmacies which can be located in any state in the [place].  You must communicate directly with them to have this medication filled.  When the pharmacy contacts you, they will need your mailing address (for shipment of the medication) andy they will need payment information if you have a copay (typically no more than $10). If you have not heard from them  2-3 days after your appointment with Dr. Georgina Snell, contact [facility] directly at [phone].  Follow-up in 6 weeks.

## 2021-03-05 ENCOUNTER — Other Ambulatory Visit: Payer: Self-pay | Admitting: Family Medicine

## 2021-03-05 NOTE — Telephone Encounter (Signed)
Requested Prescriptions  Pending Prescriptions Disp Refills  . atorvastatin (LIPITOR) 80 MG tablet [Pharmacy Med Name: ATORVASTATIN 80 MG TABLET] 90 tablet 2    Sig: TAKE 1 TABLET BY MOUTH EVERY DAY     Cardiovascular:  Antilipid - Statins Failed - 03/05/2021 12:48 AM      Failed - Total Cholesterol in normal range and within 360 days    Cholesterol, Total  Date Value Ref Range Status  12/01/2020 93 (L) 100 - 199 mg/dL Final         Passed - LDL in normal range and within 360 days    LDL Chol Calc (NIH)  Date Value Ref Range Status  12/01/2020 39 0 - 99 mg/dL Final         Passed - HDL in normal range and within 360 days    HDL  Date Value Ref Range Status  12/01/2020 43 >39 mg/dL Final         Passed - Triglycerides in normal range and within 360 days    Triglycerides  Date Value Ref Range Status  12/01/2020 41 0 - 149 mg/dL Final         Passed - Patient is not pregnant      Passed - Valid encounter within last 12 months    Recent Outpatient Visits          3 months ago Annual physical exam   [facility] Birdie Sons, MD   1 year ago Need for shingles vaccine   [facility] Birdie Sons, MD   2 years ago Annual physical exam   [facility] Birdie Sons, MD   2 years ago 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

## 2021-03-08 NOTE — Progress Notes (Signed)
I, Dylan Crawford, LAT, ATC, am serving as scribe for Dr. Lynne Leader.  Dylan Clinton Sr. is a 65 y.o. male who presents to [place] [facility] today for f/u of L knee pain.  He was last seen by Dr. Georgina Snell on 01/26/21 and had a L knee injection.  He was also prescribed Pennsaid.  Since his last visit, pt reports that his L knee is doing much better.  He states that it took a while for the injection to kick in.  He's purchased a different type of knee sleeve which is helping w/ his work.  He con't to have difficulty w/ walking down a hill or walking down steps and feels a type of weakness/instability in his knee.    Additionally Dylan Crawford notes some generalized body aches.  He attributes this to his Lipitor.  He is on high-dose Lipitor after having a stroke a few years ago.  He notes he is some mild muscle soreness and is not sure if the statin is to blame or not.  He started talking about this with his neurologist but does not feel that he is being heard.    Pertinent review of systems: No fevers or chills  Relevant historical information: History of stroke on high-dose statins.   Exam:  BP 120/78 (BP Location: Right Arm, Patient Position: Sitting, Cuff Size: Large)   Pulse (!) 58   Ht 6\' 1"  (1.854 m)   Wt 225 lb 12.8 oz (102.4 kg)   SpO2 99%   BMI 29.79 kg/m  General: Well Developed, well nourished, and in no acute distress.   MSK: Left knee normal motion.  Slight decreased quad strength.    Lab and Radiology Results  X-ray images left knee obtained today personally and independently interpreted Minimal medial patellofemoral DJD.  No acute fractures. Await formal radiology review  Lab Results  Component Value Date   LDLCALC 39 12/01/2020       Assessment and Plan: 65 y.o. male with left knee pain and a little bit of quad weakness.  He did quite well with steroid injection June 2 but still is experiencing some leg weakness and dysfunction.  He is an  excellent candidate for physical therapy.  He lives in [place] so we will refer to [facility]. recheck in about 6 weeks or if not improved.  Could consider hyaluronic acid injections in the future.  X-ray obtained today.  Additionally patient mentions some myalgias that possibly are due to statins.  He takes 80 mg of Lipitor and has excellent control of LDL.  He may be experiencing some statin myalgias although it is very difficult to tell as his symptoms are quite mild.  He may benefit from additional trials of other statins including possible rosuvastatin, or Pitavastatin or even in the future other medicine such as Repatha or Praluent.  Discussed this a bit with him.  Reasonable to have a brief trial off of it then on again the Lipitor to see if he feels better off of the statin then worse again back on it to see if it truly is the statin.  If he does actually feel better off statin then worse on statin he should talk to PCP about switching to next steps would recommend trial of rosuvastatin followed by Pitavastatin.  Pitavastatin is now available at [facility] drug as Zypitamag and is affordable without needing prior authorization.   PDMP not reviewed this encounter. Orders Placed This Encounter  Procedures  DG Knee AP/LAT W/Sunrise Left    Standing Status:   Future    Number of Occurrences:   1    Standing Expiration Date:   04/09/2021    Order Specific Question:   Reason for Exam (SYMPTOM  OR DIAGNOSIS REQUIRED)    Answer:   L knee pain    Order Specific Question:   Preferred imaging location?    Answer:   [facility]   Ambulatory referral to Physical Therapy    Referral Priority:   Routine    Referral Type:   Physical Medicine    Referral Reason:   Specialty Services Required    Requested Specialty:   Physical Therapy    Number of Visits Requested:   1   No orders of the defined types were placed in this encounter.    Discussed warning signs or symptoms. Please see  discharge instructions. Patient expresses understanding.   The above documentation has been reviewed and is accurate and complete Lynne Leader, M.D.

## 2021-03-09 ENCOUNTER — Encounter: Payer: Self-pay | Admitting: Family Medicine

## 2021-03-09 ENCOUNTER — Ambulatory Visit (INDEPENDENT_AMBULATORY_CARE_PROVIDER_SITE_OTHER): Payer: Managed Care, Other (non HMO)

## 2021-03-09 ENCOUNTER — Ambulatory Visit: Payer: Managed Care, Other (non HMO) | Admitting: Family Medicine

## 2021-03-09 ENCOUNTER — Other Ambulatory Visit: Payer: Self-pay

## 2021-03-09 VITALS — BP 120/78 | HR 58 | Ht 73.0 in | Wt 225.8 lb

## 2021-03-09 DIAGNOSIS — G8929 Other chronic pain: Secondary | ICD-10-CM | POA: Diagnosis not present

## 2021-03-09 DIAGNOSIS — M791 Myalgia, unspecified site: Secondary | ICD-10-CM

## 2021-03-09 DIAGNOSIS — M25562 Pain in left knee: Secondary | ICD-10-CM | POA: Diagnosis not present

## 2021-03-09 NOTE — Patient Instructions (Addendum)
Thank you for coming in today.   Please get an Xray today before you leave   I've referred you to Physical Therapy.  Let us know if you don't hear from them in one week.   If you think the Statins may be causing your pain try stopping it for 2 weeks then restarting and seeing if the pain is better off and return when you restart.  If you think the muscle soreness is coming from the Lipitor the next step is usually Crestor followed by pitivastatin followed by injectable Repatha

## 2021-03-13 NOTE — Progress Notes (Signed)
X-ray left knee shows minimal arthritis

## 2021-06-26 ENCOUNTER — Other Ambulatory Visit: Payer: Self-pay | Admitting: Family Medicine

## 2021-06-26 DIAGNOSIS — Z9109 Other allergy status, other than to drugs and biological substances: Secondary | ICD-10-CM

## 2021-06-26 NOTE — Telephone Encounter (Signed)
Requested medication (s) are due for refill today:   Yes  Requested medication (s) are on the active medication list:   Yes  Future visit scheduled:   No   Last ordered: 02/02/2019 #30, 12 refills  Returned because needs a new Rx.    Requested Prescriptions  Pending Prescriptions Disp Refills   montelukast (SINGULAIR) 10 MG tablet [Pharmacy Med Name: MONTELUKAST SOD 10 MG TABLET] 90 tablet 4    Sig: TAKE 1 TABLET BY MOUTH EVERYDAY AT BEDTIME     Pulmonology:  Leukotriene Inhibitors Passed - 06/26/2021  7:34 AM      Passed - Valid encounter within last 12 months    Recent Outpatient Visits           6 months ago Annual physical exam   [facility] Birdie Sons, MD   2 years ago Need for shingles vaccine   [facility] Birdie Sons, MD   2 years ago Annual physical exam   [facility] Birdie Sons, MD   2 years ago 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

## 2021-12-02 ENCOUNTER — Other Ambulatory Visit: Payer: Self-pay | Admitting: Family Medicine

## 2022-02-28 ENCOUNTER — Other Ambulatory Visit: Payer: Self-pay | Admitting: Family Medicine

## 2022-03-21 ENCOUNTER — Other Ambulatory Visit: Payer: Self-pay | Admitting: Family Medicine

## 2022-03-22 NOTE — Telephone Encounter (Signed)
Appointment 04/23/22. Requested Prescriptions  Pending Prescriptions Disp Refills  . atorvastatin (LIPITOR) 80 MG tablet [Pharmacy Med Name: ATORVASTATIN 80 MG TABLET] 30 tablet 1    Sig: TAKE 1 TABLET BY MOUTH EVERY DAY     Cardiovascular:  Antilipid - Statins Failed - 03/21/2022  4:34 PM      Failed - Valid encounter within last 12 months    Recent Outpatient Visits          1 year ago Annual physical exam   [facility] Birdie Sons, MD   2 years ago Need for shingles vaccine   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 [facility])   [facility] Birdie Sons, MD   6 years ago Annual physical exam   [place], MD      Future Appointments            In 1 month Ostwalt, Champion, PA-C [facility]           Failed - Lipid Panel in normal range within the last 12 months    Cholesterol, Total  Date Value Ref Range Status  12/01/2020 93 (L) 100 - 199 mg/dL Final   LDL Chol Calc (NIH)  Date Value Ref Range Status  12/01/2020 39 0 - 99 mg/dL Final   HDL  Date Value Ref Range Status  12/01/2020 43 >39 mg/dL Final   Triglycerides  Date Value Ref Range Status  12/01/2020 41 0 - 149 mg/dL Final         Passed - Patient is not pregnant

## 2022-04-15 ENCOUNTER — Other Ambulatory Visit: Payer: Self-pay | Admitting: Family Medicine

## 2022-04-19 NOTE — Progress Notes (Signed)
Established patient visit   Patient: Dylan STREBECK Sr.   DOB: [birth year]   66 y.o. Male  MRN: [id number] Visit Date: 04/23/2022  Today's healthcare provider: Mardene Speak, PA-C   CC: med refill for statin  Subjective    HPI  Patient is a 66 year old male that is in need of refill for his Atorvastatin.  He has not been seen in over 15 months.  His last lipid: Lab Results  Component Value Date   CHOL 93 (L) 12/01/2020   HDL 43 12/01/2020   LDLCALC 39 12/01/2020   TRIG 41 12/01/2020   CHOLHDL 2.2 12/01/2020   Patient is also requesting referral to GI for 5 year follow up colonoscopy due to sessile serrated cecal polyp found on last colonoscopy by Dr Vira Agar Per chart review, pt is due for colonoscopy in April of 2024/ 7 years  p last colonoscopy Medications: Outpatient Medications Prior to Visit  Medication Sig   albuterol (PROVENTIL HFA;VENTOLIN HFA) 108 (90 Base) MCG/ACT inhaler Inhale 2 puffs into the lungs every 6 (six) hours as needed for wheezing or shortness of breath.   aspirin EC 81 MG tablet Take 81 mg by mouth daily.   Diclofenac Sodium (PENNSAID) 2 % SOLN Place 1 application onto the skin 2 (two) times daily.   Diclofenac Sodium 2 % SOLN Apply to affected area twice daily (Patient taking differently: Apply 1 g topically daily as needed (pain). Apply to affected area twice daily)   EPINEPHrine 0.3 mg/0.3 mL IJ SOAJ injection INJECT 0.3 MLS (0.3 MG TOTAL) INTO THE MUSCLE ONCE FOR 1 DOSE.   fexofenadine (ALLEGRA) 180 MG tablet Take 180 mg by mouth every morning.    montelukast (SINGULAIR) 10 MG tablet TAKE 1 TABLET BY MOUTH EVERYDAY AT BEDTIME   [DISCONTINUED] atorvastatin (LIPITOR) 80 MG tablet TAKE 1 TABLET BY MOUTH EVERY DAY   No facility-administered medications prior to visit.    Review of Systems  Gastrointestinal:  Negative for abdominal pain, blood in stool, constipation, diarrhea, nausea, rectal pain and vomiting.       On occasion he see spots of  blood on the toilet tissue.  He does have history of internal hemorrhoids. His is currently not having any blood.  All other systems reviewed and are negative.  Except see HPI    Objective    BP 126/87 (BP Location: Left Arm, Patient Position: Sitting, Cuff Size: Normal)   Pulse 66   Temp 98 F (36.7 C) (Oral)   Wt 244 lb (110.7 kg)   SpO2 97%   BMI 32.19 kg/m  Vitals:   04/23/22 1401 04/23/22 1434  BP: 129/82 126/87  Pulse: 66   Temp: 98 F (36.7 C)   SpO2: 97%      Physical Exam Vitals reviewed.  Constitutional:      General: He is not in acute distress.    Appearance: Normal appearance. He is obese. He is not diaphoretic.  HENT:     Head: Normocephalic and atraumatic.  Eyes:     General: No scleral icterus.    Extraocular Movements: Extraocular movements intact.     Conjunctiva/sclera: Conjunctivae normal.     Pupils: Pupils are equal, round, and reactive to light.  Cardiovascular:     Rate and Rhythm: Normal rate and regular rhythm.     Pulses: Normal pulses.     Heart sounds: Normal heart sounds. No murmur heard. Pulmonary:     Effort: Pulmonary effort is normal.  No respiratory distress.     Breath sounds: Normal breath sounds. No wheezing or rhonchi.  Musculoskeletal:        General: Normal range of motion.     Cervical back: Neck supple.     Right lower leg: No edema.     Left lower leg: No edema.  Lymphadenopathy:     Cervical: No cervical adenopathy.  Skin:    General: Skin is warm and dry.     Findings: No rash.  Neurological:     Mental Status: He is alert and oriented to person, place, and time. Mental status is at baseline.  Psychiatric:        Mood and Affect: Mood normal.        Behavior: Behavior normal.      No results found for any visits on 04/23/22.  Assessment & Plan     Neuropathy, inflammatory or toxic (HCC)  - CBC with Differential/Platelet - CMP   Palpitations  - CBC with Differential/Platelet - Comprehensive metabolic  panel  History of CVA (cerebrovascular accident)  - CBC with Differential/Platelet - Comprehensive metabolic panel - Lipid panel - atorvastatin (LIPITOR) 80 MG tablet; Take 1 tablet (80 mg total) by mouth daily.  Dispense: 30 tablet; Refill: 1  History of colon polyps  Next colonoscopy will be in 11/2022  Elevated cholesterol  - Lipid panel - atorvastatin (LIPITOR) 80 MG tablet; Take 1 tablet (80 mg total) by mouth daily.  Dispense: 30 tablet; Refill: 1  FU as scheduled Last annual physical  at [employer] health as sn [place] employee Pt needs to scheduled "Welcome to medicare."    The patient was advised to call back or seek an in-person evaluation if the symptoms worsen or if the condition fails to improve as anticipated.  I discussed the assessment and treatment plan with the patient. The patient was provided an opportunity to ask questions and all were answered. The patient agreed with the plan and demonstrated an understanding of the instructions.  The entirety of the information documented in the History of Present Illness, Review of Systems and Physical Exam were personally obtained by me. Portions of this information were initially documented by the CMA and reviewed by me for thoroughness and accuracy.  Portions of this note were created using dictation software and may contain typographical errors.   Mardene Speak, PA-C  [facility] [phone] (phone) [phone] (fax)  [place]

## 2022-04-23 ENCOUNTER — Encounter: Payer: Self-pay | Admitting: Physician Assistant

## 2022-04-23 ENCOUNTER — Ambulatory Visit (INDEPENDENT_AMBULATORY_CARE_PROVIDER_SITE_OTHER): Payer: Medicare HMO | Admitting: Physician Assistant

## 2022-04-23 VITALS — BP 126/87 | HR 66 | Temp 98.0°F | Wt 244.0 lb

## 2022-04-23 DIAGNOSIS — Z86010 Personal history of colonic polyps: Secondary | ICD-10-CM | POA: Diagnosis not present

## 2022-04-23 DIAGNOSIS — E7800 Pure hypercholesterolemia, unspecified: Secondary | ICD-10-CM

## 2022-04-23 DIAGNOSIS — G619 Inflammatory polyneuropathy, unspecified: Secondary | ICD-10-CM

## 2022-04-23 DIAGNOSIS — Z8673 Personal history of transient ischemic attack (TIA), and cerebral infarction without residual deficits: Secondary | ICD-10-CM

## 2022-04-23 DIAGNOSIS — R002 Palpitations: Secondary | ICD-10-CM

## 2022-04-23 DIAGNOSIS — G622 Polyneuropathy due to other toxic agents: Secondary | ICD-10-CM

## 2022-04-23 MED ORDER — ATORVASTATIN CALCIUM 80 MG PO TABS: 80.0000 mg | ORAL_TABLET | Freq: Every day | ORAL | 1 refills | Status: DC

## 2022-04-26 LAB — CBC WITH DIFFERENTIAL/PLATELET
Basophils Absolute: 0.1 10*3/uL (ref 0.0–0.2)
Basos: 1 %
EOS (ABSOLUTE): 0.2 10*3/uL (ref 0.0–0.4)
Eos: 3 %
Hematocrit: 47.4 % (ref 37.5–51.0)
Hemoglobin: 15.6 g/dL (ref 13.0–17.7)
Immature Grans (Abs): 0 10*3/uL (ref 0.0–0.1)
Immature Granulocytes: 0 %
Lymphocytes Absolute: 2 10*3/uL (ref 0.7–3.1)
Lymphs: 32 %
MCH: 29.5 pg (ref 26.6–33.0)
MCHC: 32.9 g/dL (ref 31.5–35.7)
MCV: 90 fL (ref 79–97)
Monocytes Absolute: 0.5 10*3/uL (ref 0.1–0.9)
Monocytes: 8 %
Neutrophils Absolute: 3.5 10*3/uL (ref 1.4–7.0)
Neutrophils: 56 %
Platelets: 254 10*3/uL (ref 150–450)
RBC: 5.29 x10E6/uL (ref 4.14–5.80)
RDW: 13.7 % (ref 11.6–15.4)
WBC: 6.3 10*3/uL (ref 3.4–10.8)

## 2022-04-26 LAB — COMPREHENSIVE METABOLIC PANEL
ALT: 43 IU/L (ref 0–44)
AST: 27 IU/L (ref 0–40)
Albumin/Globulin Ratio: 1.6 (ref 1.2–2.2)
Albumin: 4.7 g/dL (ref 3.9–4.9)
Alkaline Phosphatase: 115 IU/L (ref 44–121)
BUN/Creatinine Ratio: 15 (ref 10–24)
BUN: 12 mg/dL (ref 8–27)
Bilirubin Total: 0.7 mg/dL (ref 0.0–1.2)
CO2: 23 mmol/L (ref 20–29)
Calcium: 10 mg/dL (ref 8.6–10.2)
Chloride: 102 mmol/L (ref 96–106)
Creatinine, Ser: 0.81 mg/dL (ref 0.76–1.27)
Globulin, Total: 3 g/dL (ref 1.5–4.5)
Glucose: 87 mg/dL (ref 70–99)
Potassium: 4.9 mmol/L (ref 3.5–5.2)
Sodium: 141 mmol/L (ref 134–144)
Total Protein: 7.7 g/dL (ref 6.0–8.5)
eGFR: 98 mL/min/{1.73_m2} (ref >59)

## 2022-04-26 LAB — LIPID PANEL
Chol/HDL Ratio: 2.4 ratio (ref 0.0–5.0)
Cholesterol, Total: 105 mg/dL (ref 100–199)
HDL: 43 mg/dL (ref >39)
LDL Chol Calc (NIH): 49 mg/dL (ref 0–99)
Triglycerides: 59 mg/dL (ref 0–149)
VLDL Cholesterol Cal: 13 mg/dL (ref 5–40)

## 2022-04-26 NOTE — Progress Notes (Signed)
[place] ,   Your labwork results all are within normal limits including lipid panel No changes need to be made to medications, and no further tests need to be ordered.  Any questions please reach out to the office or message me on MyChart!  Best, Mardene Speak, PA-C

## 2022-05-20 ENCOUNTER — Other Ambulatory Visit: Payer: Self-pay | Admitting: Family Medicine

## 2022-05-20 DIAGNOSIS — Z8673 Personal history of transient ischemic attack (TIA), and cerebral infarction without residual deficits: Secondary | ICD-10-CM

## 2022-05-20 DIAGNOSIS — E7800 Pure hypercholesterolemia, unspecified: Secondary | ICD-10-CM

## 2022-07-06 ENCOUNTER — Encounter: Payer: Managed Care, Other (non HMO) | Admitting: Family Medicine

## 2022-07-09 NOTE — Progress Notes (Unsigned)
Medicare Initial Preventative Physical Exam    Patient: Dylan LUCKOW Sr., Male    DOB: [birth year], 66 y.o.   MRN: [id number] Visit Date: 07/10/2022  Today's Provider: Lelon Huh, MD   No chief complaint on file.  Subjective    Medicare Initial Preventative Physical Exam Dylan Clinton Sr. is a 66 y.o. male who presents today for his Initial Preventative Physical Exam.   HPI  Social History   Socioeconomic History   Marital status: Married    Spouse name: Not on file   Number of children: 3   Years of education: Not on file   Highest education level: Not on file  Occupational History   Occupation: Dealer  Tobacco Use   Smoking status: Never   Smokeless tobacco: Never  Substance and Sexual Activity   Alcohol use: Yes    Alcohol/week: 0.0 standard drinks of alcohol    Comment: minimal use   Drug use: No   Sexual activity: Yes  Other Topics Concern   Not on file  Social History Narrative   Not on file   Social Determinants of Health   Financial Resource Strain: Not on file  Food Insecurity: Not on file  Transportation Needs: Not on file  Physical Activity: Not on file  Stress: Not on file  Social Connections: Not on file  Intimate Partner Violence: Not on file    Past Medical History:  Diagnosis Date   Allergy    seasonal   Arthritis    Bulging lumbar disc    Colon polyp 2017   Diverticulosis 2017   History of chicken pox    Seasonal allergies    HAS ALBUTEROL INHALER TO USE PRN   Varicose vein      Patient Active Problem List   Diagnosis Date Noted   Right ankle injury, initial encounter 03/13/2019   OSA on CPAP 01/11/2019   History of CVA (cerebrovascular accident) 10/18/2018   History of colon polyps 02/10/2016   Dyshidrotic eczema 11/08/2015   Neuropathy, 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 There is no history of Cancer or Heart disease.   Current Outpatient Medications:    albuterol (PROVENTIL HFA;VENTOLIN HFA) 108 (90 Base) MCG/ACT inhaler, Inhale 2 puffs into the lungs every 6 (six) hours as needed for wheezing or shortness of breath., Disp: 1 Inhaler, Rfl: 1   aspirin EC 81 MG tablet, Take 81 mg by mouth daily., Disp: , Rfl:    atorvastatin (LIPITOR) 80 MG tablet, TAKE 1 TABLET BY MOUTH EVERY DAY, Disp: 90 tablet, Rfl: 3   Diclofenac Sodium (PENNSAID) 2 % SOLN, Place 1 application onto the skin 2 (two) times daily., Disp: 112 g, Rfl: 2   Diclofenac Sodium 2 % SOLN, Apply to affected area twice daily (Patient taking differently: Apply 1 g topically daily as needed (pain). Apply to affected area twice daily), Disp: 1 Bottle, Rfl: 2   EPINEPHrine 0.3 mg/0.3  mL IJ SOAJ injection, INJECT 0.3 MLS (0.3 MG TOTAL) INTO THE MUSCLE ONCE FOR 1 DOSE., Disp: , Rfl:    fexofenadine (ALLEGRA) 180 MG tablet, Take 180 mg by mouth every morning. , Disp: , Rfl:    montelukast (SINGULAIR) 10 MG tablet, TAKE 1 TABLET BY MOUTH EVERYDAY AT BEDTIME, Disp: 90 tablet, Rfl: 4   Patient Care Team: Birdie Sons, MD as PCP - General (Family Medicine) Caryn Section, Linden Dolin, PA-C as Physician Assistant (Physician Assistant) [name], MD (General Surgery) Wynona Canes, MD as Referring Physician (Neurology)  Review of Systems  {Labs  Heme  Chem  Endocrine  Serology  Results Review (optional):[id number]}  Objective    Vitals: There were no vitals taken for this visit. No  results found. Physical Exam ***  Activities of Daily Living    04/23/2022    2:00 PM  In your present state of health, do you have any difficulty performing the following activities:  Hearing? 0  Vision? 0  Difficulty concentrating or making decisions? 0  Walking or climbing stairs? 0  Dressing or bathing? 0  Doing errands, shopping? 0    Fall Risk Assessment    04/23/2022    2:00 PM 11/30/2020   10:10 AM 11/03/2018    2:21 PM  [place] in the past year? 0 0 0  Number falls in past yr:  0   Injury with Fall?  0   Follow up  Falls evaluation completed      Depression Screen    04/23/2022    2:00 PM 11/30/2020   10:10 AM 01/26/2019    2:18 PM 11/03/2018    2:21 PM  PHQ 2/9 Scores  PHQ - 2 Score 0 0 0 0  PHQ- 9 Score 0 0  2        No data to display          No results found for any visits on 07/10/22.  Assessment & Plan      Initial Preventative Physical Exam  Reviewed patient's Family Medical History Reviewed and updated list of patient's medical providers Assessment of cognitive impairment was done Assessed patient's functional ability Established a written schedule for health screening [place] Completed and Reviewed  Exercise Activities and Dietary recommendations  Goals   None     Immunization History  Administered Date(s) Administered   Hepatitis B 05/16/2000, 06/13/2000, 11/14/2000   Influenza,inj,Quad PF,6+ Mos 07/14/2020   Influenza-Unspecified 05/27/2018   Moderna Covid-19 Vaccine Bivalent Booster 52yr & up 08/08/2021   Moderna Sars-Covid-2 Vaccination 08/27/2019, 09/25/2019, 06/29/2020   Td 03/10/1998   Tdap 10/23/2010   Zoster Recombinat (Shingrix) 01/26/2019, 03/30/2019    Health Maintenance  Topic Date Due   Medicare Annual Wellness (AWV)  Never done   HIV Screening  Never done   TETANUS/TDAP  10/23/2020   Pneumonia Vaccine 65 Years old (1 - PCV) Never done   COVID-19 Vaccine (5 - Moderna series)  02/26/2022   COLONOSCOPY (Pts 45-429yrInsurance coverage will need to be confirmed)  01/10/2023   INFLUENZA VACCINE  Completed   Hepatitis C Screening  Completed   Zoster Vaccines- Shingrix  Completed   HPV VACCINES  Aged Out     Discussed health benefits of physical activity, and encouraged him to engage in regular exercise appropriate for his age and condition.   ***  No follow-ups on file.     {provider attestation***:1}   DoLelon HuhMD  [facility]  Family Practice [phone] (phone) [phone] (fax)  [place]

## 2022-07-10 ENCOUNTER — Encounter: Payer: Self-pay | Admitting: Family Medicine

## 2022-07-10 ENCOUNTER — Ambulatory Visit (INDEPENDENT_AMBULATORY_CARE_PROVIDER_SITE_OTHER): Payer: Medicare HMO | Admitting: Family Medicine

## 2022-07-10 VITALS — BP 113/75 | HR 72 | Temp 98.3°F | Ht 73.0 in | Wt 240.0 lb

## 2022-07-10 DIAGNOSIS — Z8673 Personal history of transient ischemic attack (TIA), and cerebral infarction without residual deficits: Secondary | ICD-10-CM | POA: Diagnosis not present

## 2022-07-10 DIAGNOSIS — Z0000 Encounter for general adult medical examination without abnormal findings: Secondary | ICD-10-CM | POA: Diagnosis not present

## 2022-07-10 DIAGNOSIS — Z125 Encounter for screening for malignant neoplasm of prostate: Secondary | ICD-10-CM

## 2022-07-10 DIAGNOSIS — D229 Melanocytic nevi, unspecified: Secondary | ICD-10-CM | POA: Diagnosis not present

## 2022-07-10 MED ORDER — COQ-10 200 MG PO CAPS: 200.0000 mg | ORAL_CAPSULE | Freq: Every day | ORAL | 0 refills | Status: AC

## 2022-07-10 NOTE — Patient Instructions (Addendum)
Please review the attached list of medications and notify my office if there are any errors.   I recommend that you get the Prevnar 20 vaccine to protect yourself from certain dangerous strains of pneumonia. You can get Prevnar 20 at your pharmacy, or call our office at [phone] at your earliest convenience to schedule this vaccine.   Please go to the lab draw station in [address] on the second floor of [facility]. Normal hours are 8:00am to 11:00am and 1:00pm to 4:00pm Monday through Friday

## 2022-07-14 LAB — PSA TOTAL (REFLEX TO FREE): Prostate Specific Ag, Serum: 0.3 ng/mL (ref 0.0–4.0)

## 2022-08-30 ENCOUNTER — Other Ambulatory Visit: Payer: Self-pay | Admitting: Family Medicine

## 2022-08-30 DIAGNOSIS — Z9109 Other allergy status, other than to drugs and biological substances: Secondary | ICD-10-CM

## 2023-03-27 ENCOUNTER — Telehealth: Payer: Self-pay

## 2023-03-27 NOTE — Telephone Encounter (Signed)
LVM for patient to call back [phone], or to call PCP office to schedule follow up apt. AS, CMA  

## 2023-03-30 ENCOUNTER — Other Ambulatory Visit: Payer: Self-pay | Admitting: Family Medicine

## 2023-03-30 DIAGNOSIS — E7800 Pure hypercholesterolemia, unspecified: Secondary | ICD-10-CM

## 2023-03-30 DIAGNOSIS — Z8673 Personal history of transient ischemic attack (TIA), and cerebral infarction without residual deficits: Secondary | ICD-10-CM

## 2023-04-01 NOTE — Telephone Encounter (Signed)
Requested Prescriptions  Refused Prescriptions Disp Refills   atorvastatin (LIPITOR) 80 MG tablet [Pharmacy Med Name: ATORVASTATIN 80 MG TABLET] 90 tablet 3    Sig: TAKE 1 TABLET BY MOUTH EVERY DAY     Cardiovascular:  Antilipid - Statins Failed - 03/30/2023  2:41 PM      Failed - Lipid Panel in normal range within the last 12 months    Cholesterol, Total  Date Value Ref Range Status  04/23/2022 105 100 - 199 mg/dL Final   LDL Chol Calc (NIH)  Date Value Ref Range Status  04/23/2022 49 0 - 99 mg/dL Final   HDL  Date Value Ref Range Status  04/23/2022 43 >39 mg/dL Final   Triglycerides  Date Value Ref Range Status  04/23/2022 59 0 - 149 mg/dL Final         Passed - Patient is not pregnant      Passed - Valid encounter within last 12 months    Recent Outpatient Visits           8 months ago Encounter for initial preventive physical examination covered by [facility] Malva Limes, MD   11 months ago Neuropathy, inflammatory or toxic [facility])   [place] [facility] [place], [place], PA-C   2 years ago Annual physical exam   [facility] Malva Limes, MD   4 years ago Need for shingles vaccine   [facility] Malva Limes, MD   4 years ago Annual physical exam   [facility] Malva Limes, MD

## 2023-05-10 ENCOUNTER — Other Ambulatory Visit: Payer: Self-pay | Admitting: Family Medicine

## 2023-05-10 DIAGNOSIS — Z8673 Personal history of transient ischemic attack (TIA), and cerebral infarction without residual deficits: Secondary | ICD-10-CM

## 2023-05-10 DIAGNOSIS — E7800 Pure hypercholesterolemia, unspecified: Secondary | ICD-10-CM

## 2023-05-21 ENCOUNTER — Telehealth: Payer: Self-pay

## 2023-05-21 NOTE — Telephone Encounter (Signed)
Cal back or send mychart message 7-10 days before his appt

## 2023-05-21 NOTE — Telephone Encounter (Signed)
Copied from CRM [id number]. Topic: Appointment Scheduling - Scheduling Inquiry for Clinic >> May 21, 2023 10:23 AM Marlow Baars wrote: Reason for CRM: The patient called in stating since he is scheduled for his physical in November he would like to get an order for labs prior to it so he can go over them with his provider at his appt. Please assist patient further.

## 2023-05-23 NOTE — Telephone Encounter (Signed)
Patient advised.

## 2023-07-05 ENCOUNTER — Encounter: Payer: Self-pay | Admitting: Family Medicine

## 2023-07-05 DIAGNOSIS — Z125 Encounter for screening for malignant neoplasm of prostate: Secondary | ICD-10-CM

## 2023-07-05 DIAGNOSIS — I679 Cerebrovascular disease, unspecified: Secondary | ICD-10-CM

## 2023-07-08 DIAGNOSIS — I679 Cerebrovascular disease, unspecified: Secondary | ICD-10-CM | POA: Insufficient documentation

## 2023-07-10 LAB — CBC
Hematocrit: 46.6 % (ref 37.5–51.0)
Hemoglobin: 15.4 g/dL (ref 13.0–17.7)
MCH: 29.7 pg (ref 26.6–33.0)
MCHC: 33 g/dL (ref 31.5–35.7)
MCV: 90 fL (ref 79–97)
Platelets: 243 10*3/uL (ref 150–450)
RBC: 5.19 x10E6/uL (ref 4.14–5.80)
RDW: 15 % (ref 11.6–15.4)
WBC: 6 10*3/uL (ref 3.4–10.8)

## 2023-07-10 LAB — COMPREHENSIVE METABOLIC PANEL
ALT: 34 [IU]/L (ref 0–44)
AST: 28 [IU]/L (ref 0–40)
Albumin: 4.3 g/dL (ref 3.9–4.9)
Alkaline Phosphatase: 120 [IU]/L (ref 44–121)
BUN/Creatinine Ratio: 12 (ref 10–24)
BUN: 10 mg/dL (ref 8–27)
Bilirubin Total: 0.5 mg/dL (ref 0.0–1.2)
CO2: 24 mmol/L (ref 20–29)
Calcium: 9.3 mg/dL (ref 8.6–10.2)
Chloride: 102 mmol/L (ref 96–106)
Creatinine, Ser: 0.81 mg/dL (ref 0.76–1.27)
Globulin, Total: 3.1 g/dL (ref 1.5–4.5)
Glucose: 92 mg/dL (ref 70–99)
Potassium: 4.5 mmol/L (ref 3.5–5.2)
Sodium: 139 mmol/L (ref 134–144)
Total Protein: 7.4 g/dL (ref 6.0–8.5)
eGFR: 97 mL/min/{1.73_m2} (ref >59)

## 2023-07-10 LAB — LIPID PANEL
Chol/HDL Ratio: 2.5 ratio (ref 0.0–5.0)
Cholesterol, Total: 106 mg/dL (ref 100–199)
HDL: 43 mg/dL (ref >39)
LDL Chol Calc (NIH): 50 mg/dL (ref 0–99)
Triglycerides: 55 mg/dL (ref 0–149)
VLDL Cholesterol Cal: 13 mg/dL (ref 5–40)

## 2023-07-10 LAB — PSA: Prostate Specific Ag, Serum: 0.3 ng/mL (ref 0.0–4.0)

## 2023-07-12 ENCOUNTER — Ambulatory Visit (INDEPENDENT_AMBULATORY_CARE_PROVIDER_SITE_OTHER): Payer: Medicare HMO | Admitting: Family Medicine

## 2023-07-12 VITALS — BP 125/82 | HR 64 | Ht 73.0 in | Wt 239.0 lb

## 2023-07-12 DIAGNOSIS — I679 Cerebrovascular disease, unspecified: Secondary | ICD-10-CM | POA: Diagnosis not present

## 2023-07-12 DIAGNOSIS — Z23 Encounter for immunization: Secondary | ICD-10-CM

## 2023-07-12 DIAGNOSIS — K5790 Diverticulosis of intestine, part unspecified, without perforation or abscess without bleeding: Secondary | ICD-10-CM

## 2023-07-12 DIAGNOSIS — Z860100 Personal history of colon polyps, unspecified: Secondary | ICD-10-CM | POA: Diagnosis not present

## 2023-07-12 DIAGNOSIS — J45909 Unspecified asthma, uncomplicated: Secondary | ICD-10-CM

## 2023-07-12 DIAGNOSIS — Z0000 Encounter for general adult medical examination without abnormal findings: Secondary | ICD-10-CM

## 2023-07-12 DIAGNOSIS — G4733 Obstructive sleep apnea (adult) (pediatric): Secondary | ICD-10-CM

## 2023-07-12 MED ORDER — EPINEPHRINE 0.3 MG/0.3ML IJ SOAJ: INTRAMUSCULAR | 3 refills | Status: AC

## 2023-07-12 MED ORDER — ALBUTEROL SULFATE HFA 108 (90 BASE) MCG/ACT IN AERS
2.0000 | INHALATION_SPRAY | Freq: Four times a day (QID) | RESPIRATORY_TRACT | 3 refills | Status: AC | PRN
Start: 2023-07-12 — End: ?

## 2023-07-12 NOTE — Patient Instructions (Signed)
 Please review the attached list of medications and notify my office if there are any errors.   I recommend that you get the RSV vaccine (Arexvy) to protect yourself from certain types of viral pneumonia. This vaccine is typically covered under Medicare prescription plans and should be covered if you get it at your pharmacy.

## 2023-07-12 NOTE — Progress Notes (Signed)
Complete physical exam   Patient: Dylan DEPENA Sr.   DOB: Oct 07, 1955   67 y.o. Male  MRN: [id number] Visit Date: 07/12/2023  Today's healthcare provider: Mila Merry, MD   Chief Complaint  Patient presents with   Annual Exam   Hyperlipidemia   Subjective    Discussed the use of AI scribe software for clinical note transcription with the patient, who gave verbal consent to proceed.  History of Present Illness   The patient presents for a routine physical. He reports experiencing two to three colds in the past month, which have been difficult to resolve. The patient describes a sensation of congestion in the chest, which is alleviated by the use of an albuterol inhaler. The inhaler also aids in the expulsion of phlegm. Despite efforts to mitigate exposure to allergens, such as wearing a mask while mowing grass or blowing leaves, the patient notes a recent cold onset following such activities.  The patient also discusses his use of a CPAP machine, which has been temporarily discontinued due to the recent colds and seasonal allergies. He expresses a desire to resume its use, acknowledging its benefits, despite finding it inconvenient.  In terms of gastrointestinal health, the patient mentions a past issue with diverticulosis and a tendency to avoid certain foods, such as seeds, nuts, and spicy foods, to prevent discomfort and potential blockages. He expresses an awareness of the importance of fiber and hydration in maintaining digestive health.  Lastly, he continues on atorvastatin with history of CVA. He experiences muscle and joint pains frequently and is concerned they may be side effects of the statin.         Past Medical History:  Diagnosis Date   Allergy    seasonal   Arthritis    Bulging lumbar disc    Colon polyp 2017   Diverticulosis 2017   History of chicken pox    Seasonal allergies    HAS ALBUTEROL INHALER TO USE PRN   Varicose vein    Past Surgical  History:  Procedure Laterality Date   COLONOSCOPY  2017   HERNIA REPAIR Right 1979   Inguinal hernia repair   UMBILICAL HERNIA REPAIR N/A 09/11/2016   Procedure: HERNIA REPAIR UMBILICAL ADULT with mesh;  Surgeon: Kieth Brightly, MD;  Location: [facility] ORS;  Service: General;  Laterality: N/A;   Social History   Socioeconomic History   Marital status: Married    Spouse name: Not on file   Number of children: 3   Years of education: Not on file   Highest education level: Associate degree: occupational, Scientist, product/process development, or vocational program  Occupational History   Occupation: Curator  Tobacco Use   Smoking status: Never   Smokeless tobacco: Never  Substance and Sexual Activity   Alcohol use: Yes    Alcohol/week: 0.0 standard drinks of alcohol    Comment: minimal use   Drug use: No   Sexual activity: Yes  Other Topics Concern   Not on file  Social History Narrative   Not on file   Social Determinants of Health   Financial Resource Strain: Low Risk  (07/12/2023)   Overall Financial Resource Strain (CARDIA)    Difficulty of Paying Living Expenses: Not hard at all  Food Insecurity: No Food Insecurity (07/12/2023)   Hunger Vital Sign    Worried About Running Out of Food in the Last Year: Never true    Ran Out of Food in the Last Year: Never true  Transportation Needs:  No Transportation Needs (07/12/2023)   PRAPARE - Administrator, Civil Service (Medical): No    Lack of Transportation (Non-Medical): No  Physical Activity: Insufficiently Active (07/12/2023)   Exercise Vital Sign    Days of Exercise per Week: 3 days    Minutes of Exercise per Session: 30 min  Stress: No Stress Concern Present (07/12/2023)   [employer] of Occupational Health - Occupational Stress Questionnaire    Feeling of Stress : Not at all  Social Connections: Socially Integrated (07/12/2023)   Social Connection and Isolation Panel [NHANES]    Frequency of Communication with Friends and  Family: Twice a week    Frequency of Social Gatherings with Friends and Family: Twice a week    Attends Religious Services: More than 4 times per year    Active Member of [employer] or Organizations: Yes    Attends Engineer, structural: More than 4 times per year    Marital Status: Married  Catering manager Violence: Not on file   Family Status  Relation Name Status   Other gen. family hx Alive   Mother  Deceased       Respiratory problems   Father  Alive   Neg Hx  (Not Specified)  No partnership data on file   Family History  Problem Relation Age of Onset   Stroke Other    Cancer Neg Hx    Heart disease Neg Hx    Allergies  Allergen Reactions   Grass Pollen(K-O-R-T-Swt Vern) Shortness Of Breath    Runny, coughing when mowing    Patient Care Team: Malva Limes, MD as PCP - General (Family Medicine) Jay Schlichter, MD as Referring Physician (Neurology) Pa, [facility] (Dermatology)   Medications: Outpatient Medications Prior to Visit  Medication Sig   aspirin EC 81 MG tablet Take 81 mg by mouth daily.   atorvastatin (LIPITOR) 80 MG tablet TAKE 1 TABLET BY MOUTH EVERY DAY   Coenzyme Q10 (COQ-10) 200 MG CAPS Take 200 mg by mouth daily.   Diclofenac Sodium (PENNSAID) 2 % SOLN Place 1 application onto the skin 2 (two) times daily.   fexofenadine (ALLEGRA) 180 MG tablet Take 180 mg by mouth every morning.    montelukast (SINGULAIR) 10 MG tablet TAKE 1 TABLET BY MOUTH EVERYDAY AT BEDTIME   albuterol (PROVENTIL HFA;VENTOLIN HFA) 108 (90 Base) MCG/ACT inhaler Inhale 2 puffs into the lungs every 6 (six) hours as needed for wheezing or shortness of breath.   EPINEPHrine 0.3 mg/0.3 mL IJ SOAJ injection INJECT 0.3 MLS (0.3 MG TOTAL) INTO THE MUSCLE ONCE FOR 1 DOSE.   No facility-administered medications prior to visit.       Objective    BP 125/82 (BP Location: Left Arm, Patient Position: Sitting, Cuff Size: Normal)   Pulse 64   Ht 6\' 1"  (1.854 m)   Wt 239  lb (108.4 kg)   SpO2 97%   BMI 31.53 kg/m    Physical Exam  General Appearance:    Mildly obese male. Alert, cooperative, in no acute distress, appears stated age  Head:    Normocephalic, without obvious abnormality, atraumatic  Eyes:    PERRL, conjunctiva/corneas clear, EOM's intact, fundi    benign, both eyes       Ears:    Normal TM's and external ear canals, both ears  Nose:   Nares normal, septum midline, mucosa normal, no drainage   or sinus tenderness  Throat:   Lips, mucosa, and tongue normal;  teeth and gums normal  Neck:   Supple, symmetrical, trachea midline, no adenopathy;       thyroid:  No enlargement/tenderness/nodules; no carotid   bruit or JVD  Back:     Symmetric, no curvature, ROM normal, no CVA tenderness  Lungs:     Clear to auscultation bilaterally, respirations unlabored  Chest wall:    No tenderness or deformity  Heart:    Normal heart rate. Normal rhythm. No murmurs, rubs, or gallops.  S1 and S2 normal  Abdomen:     Soft, non-tender, bowel sounds active all four quadrants,    no masses, no organomegaly  Genitalia:    deferred  Rectal:    deferred  Extremities:   All extremities are intact. No cyanosis or edema  Pulses:   2+ and symmetric all extremities  Skin:   Skin color, texture, turgor normal, no rashes or lesions  Lymph nodes:   Cervical, supraclavicular, and axillary nodes normal  Neurologic:   CNII-XII intact. Normal strength, sensation and reflexes      throughout       Last depression screening scores    07/10/2022   10:59 AM 04/23/2022    2:00 PM 11/30/2020   10:10 AM  PHQ 2/9 Scores  PHQ - 2 Score 0 0 0  PHQ- 9 Score 0 0 0   Last fall risk screening    07/10/2022   10:58 AM  Fall Risk   Falls in the past year? 0  Number falls in past yr: 0  Injury with Fall? 0  Risk for fall due to : No Fall Risks  Follow up Falls evaluation completed   Last Audit-C alcohol use screening    07/12/2023    1:05 PM  Alcohol Use Disorder Test  (AUDIT)  1. How often do you have a drink containing alcohol? 1  2. How many drinks containing alcohol do you have on a typical day when you are drinking? 0  3. How often do you have six or more drinks on one occasion? 0  AUDIT-C Score 1   A score of 3 or more in women, and 4 or more in men indicates increased risk for alcohol abuse, EXCEPT if all of the points are from question 1   No results found for any visits on 07/12/23.  Assessment & Plan    Routine Health Maintenance and Physical Exam  Exercise Activities and Dietary recommendations  Goals   None     Immunization History  Administered Date(s) Administered   Hepatitis B 05/16/2000, 06/13/2000, 11/14/2000   Influenza Split 07/03/2022   Influenza,inj,Quad PF,6+ Mos 07/14/2020   Influenza-Unspecified 05/27/2018   Moderna Covid-19 Vaccine Bivalent Booster 4yrs & up 08/08/2021   Moderna Sars-Covid-2 Vaccination 08/27/2019, 09/25/2019, 06/29/2020   Td 03/10/1998   Tdap 10/23/2010   Zoster Recombinant(Shingrix) 01/26/2019, 03/30/2019    Health Maintenance  Topic Date Due   DTaP/Tdap/Td (3 - Td or Tdap) 10/23/2020   Pneumonia Vaccine 83+ Years old (1 of 1 - PCV) Never done   Colonoscopy  01/10/2023   INFLUENZA VACCINE  03/28/2023   COVID-19 Vaccine (5 - 2023-24 season) 04/28/2023   Medicare Annual Wellness (AWV)  07/11/2023   Hepatitis C Screening  Completed   Zoster Vaccines- Shingrix  Completed   HPV VACCINES  Aged Out    Discussed health benefits of physical activity, and encouraged him to engage in regular exercise appropriate for his age and condition.     General Health Maintenance -Administer influenza  vaccine and pneumococcal vaccine today. -Recommend RSV vaccine at a pharmacy.  History of adenomatous colon polyps.  -Refer colonoscopy due to history of polyps.   Recent history of multiple colds and congestion. Albuterol inhaler provides relief. Possible exposure to allergens during yard work. -Continue  use of Albuterol inhaler as needed. -Consider allergy testing or referral to an allergist if symptoms persist.  Hyperlipidemia Currently on atorvastatin. Patient has concerns about potential muscle aches and liver effects. -Continue current statin medication. -Consider trial off statin for two weeks to assess for muscle aches and consider switching to a different statin if muscle aches are significant.  Diverticulosis Patient reports avoiding certain foods and consuming fiber and water to manage condition. -Continue high fiber diet and adequate hydration. -Consider referral to a gastroenterologist if symptoms worsen.  Sleep Apnea Patient has a CPAP machine but reports difficulty using it during periods of respiratory illness. -Encourage consistent use of CPAP machine when possible.     Return in about 1 year (around 07/11/2024) for Yearly Physical.        Mila Merry, MD  [facility] [phone] (phone) [phone] (fax)  [facility]

## 2023-07-16 ENCOUNTER — Ambulatory Visit: Payer: Medicare HMO

## 2023-07-16 DIAGNOSIS — Z0000 Encounter for general adult medical examination without abnormal findings: Secondary | ICD-10-CM

## 2023-07-16 NOTE — Progress Notes (Signed)
Subjective:   Dylan SICOTTE Sr. is a 67 y.o. male who presents for Medicare Annual/Subsequent preventive examination.  Visit Complete: Virtual I connected with  Dylan Mast Sr. on 07/16/23 by a audio enabled telemedicine application and verified that I am speaking with the correct person using two identifiers.  Patient Location: Home  Provider Location: Office/Clinic  I discussed the limitations of evaluation and management by telemedicine. The patient expressed understanding and agreed to proceed.  Vital Signs: Because this visit was a virtual/telehealth visit, some criteria may be missing or patient reported. Any vitals not documented were not able to be obtained and vitals that have been documented are patient reported.  Cardiac Risk Factors include: advanced age (>70men, >69 women);obesity (BMI >30kg/m2)     Objective:    There were no vitals filed for this visit. There is no height or weight on file to calculate BMI.     07/16/2023    1:31 PM 09/04/2016    3:07 PM  Advanced Directives  Does Patient Have a Medical Advance Directive? No No  Would patient like information on creating a medical advance directive? No - Patient declined     Current Medications (verified) Outpatient Encounter Medications as of 07/16/2023  Medication Sig   albuterol (VENTOLIN HFA) 108 (90 Base) MCG/ACT inhaler Inhale 2 puffs into the lungs every 6 (six) hours as needed for wheezing or shortness of breath.   aspirin EC 81 MG tablet Take 81 mg by mouth daily.   atorvastatin (LIPITOR) 80 MG tablet TAKE 1 TABLET BY MOUTH EVERY DAY   Coenzyme Q10 (COQ-10) 200 MG CAPS Take 200 mg by mouth daily.   Diclofenac Sodium (PENNSAID) 2 % SOLN Place 1 application onto the skin 2 (two) times daily.   EPINEPHrine 0.3 mg/0.3 mL IJ SOAJ injection INJECT 0.3 MLS (0.3 MG TOTAL) INTO THE MUSCLE ONCE FOR 1 DOSE.   fexofenadine (ALLEGRA) 180 MG tablet Take 180 mg by mouth every morning.    montelukast  (SINGULAIR) 10 MG tablet TAKE 1 TABLET BY MOUTH EVERYDAY AT BEDTIME   No facility-administered encounter medications on file as of 07/16/2023.    Allergies (verified) Grass pollen([id number]-o-[id number]-t-swt vern)   History: Past Medical History:  Diagnosis Date   Allergy    seasonal   Arthritis    Bulging lumbar disc    Colon polyp 2017   Diverticulosis 2017   History of chicken pox    Seasonal allergies    HAS ALBUTEROL INHALER TO USE PRN   Varicose vein    Past Surgical History:  Procedure Laterality Date   COLONOSCOPY  2017   HERNIA REPAIR Right 1979   Inguinal hernia repair   UMBILICAL HERNIA REPAIR N/A 09/11/2016   Procedure: HERNIA REPAIR UMBILICAL ADULT with mesh;  Surgeon: Kieth Brightly, MD;  Location: [facility] ORS;  Service: General;  Laterality: N/A;   Family History  Problem Relation Age of Onset   Stroke Other    Cancer Neg Hx    Heart disease Neg Hx    Social History   Socioeconomic History   Marital status: Married    Spouse name: Not on file   Number of children: 3   Years of education: Not on file   Highest education level: Associate degree: occupational, Scientist, product/process development, or vocational program  Occupational History   Occupation: Curator  Tobacco Use   Smoking status: Never   Smokeless tobacco: Never  Substance and Sexual Activity   Alcohol use: Yes  Alcohol/week: 0.0 standard drinks of alcohol    Comment: minimal use   Drug use: No   Sexual activity: Yes  Other Topics Concern   Not on file  Social History Narrative   Not on file   Social Determinants of Health   Financial Resource Strain: Low Risk  (07/16/2023)   Overall Financial Resource Strain (CARDIA)    Difficulty of Paying Living Expenses: Not hard at all  Food Insecurity: No Food Insecurity (07/16/2023)   Hunger Vital Sign    Worried About Running Out of Food in the Last Year: Never true    Ran Out of Food in the Last Year: Never true  Transportation Needs: No Transportation Needs  (07/16/2023)   PRAPARE - Administrator, Civil Service (Medical): No    Lack of Transportation (Non-Medical): No  Physical Activity: Sufficiently Active (07/16/2023)   Exercise Vital Sign    Days of Exercise per Week: 3 days    Minutes of Exercise per Session: 50 min  Recent Concern: Physical Activity - Insufficiently Active (07/12/2023)   Exercise Vital Sign    Days of Exercise per Week: 3 days    Minutes of Exercise per Session: 30 min  Stress: No Stress Concern Present (07/16/2023)   [employer] of Occupational Health - Occupational Stress Questionnaire    Feeling of Stress : Not at all  Social Connections: Socially Integrated (07/16/2023)   Social Connection and Isolation Panel [NHANES]    Frequency of Communication with Friends and Family: Three times a week    Frequency of Social Gatherings with Friends and Family: Twice a week    Attends Religious Services: More than 4 times per year    Active Member of [employer] or Organizations: Yes    Attends Engineer, structural: More than 4 times per year    Marital Status: Married    Tobacco Counseling Counseling given: Not Answered   Clinical Intake:  Pre-visit preparation completed: Yes  Pain : No/denies pain     BMI - recorded: 31.5 Nutritional Status: BMI > 30  Obese Nutritional Risks: None Diabetes: No  How often do you need to have someone help you when you read instructions, pamphlets, or other written materials from your doctor or pharmacy?: 1 - Never  Interpreter Needed?: No  Information entered by :: Kennedy Bucker, LPN   Activities of Daily Living    07/16/2023    1:33 PM 07/15/2023    6:59 PM  In your present state of health, do you have any difficulty performing the following activities:  Hearing? 0 0  Vision? 0 0  Difficulty concentrating or making decisions? 0 0  Walking or climbing stairs? 0 0  Dressing or bathing? 0 0  Doing errands, shopping? 0 0  Preparing Food and  eating ? N N  Using the Toilet? N N  In the past six months, have you accidently leaked urine? N N  Do you have problems with loss of bowel control? N N  Managing your Medications? N N  Managing your Finances? N N  Housekeeping or managing your Housekeeping? N N    Patient Care Team: Malva Limes, MD as PCP - General (Family Medicine) Jay Schlichter, MD as Referring Physician (Neurology) Pa, [facility], [facility]  Indicate any recent Medical Services you may have received from other than [facility] providers in the past year (date may be approximate).     Assessment:   This is a  routine wellness examination for Mieczyslaw.  Hearing/Vision screen Hearing Screening - Comments:: No aids Vision Screening - Comments:: Wears glasses- Dr.Woodard   Goals Addressed             This Visit's Progress    DIET - EAT MORE FRUITS AND VEGETABLES         Depression Screen    07/16/2023    1:30 PM 07/10/2022   10:59 AM 04/23/2022    2:00 PM 11/30/2020   10:10 AM 01/26/2019    2:18 PM 11/03/2018    2:21 PM 11/08/2015    2:27 PM  PHQ 2/9 Scores  PHQ - 2 Score 0 0 0 0 0 0 0  PHQ- 9 Score 0 0 0 0  2 0    Fall Risk    07/16/2023    1:32 PM 07/15/2023    6:59 PM 07/10/2022   10:58 AM 04/23/2022    2:00 PM 11/30/2020   10:10 AM  Fall Risk   Falls in the past year? 0 0 0 0 0  Number falls in past yr: 0 0 0  0  Injury with Fall? 0  0  0  Risk for fall due to : No Fall Risks  No Fall Risks    Follow up Falls prevention discussed;Falls evaluation completed  Falls evaluation completed  Falls evaluation completed    MEDICARE RISK AT HOME: Medicare Risk at Home Any stairs in or around the home?: Yes If so, are there any without handrails?: No Home free of loose throw rugs in walkways, pet beds, electrical cords, etc?: Yes Adequate lighting in your home to reduce risk of falls?: Yes Life alert?: No Use of a cane, walker or w/c?: No Grab bars in the  bathroom?: No Shower chair or bench in shower?: No Elevated toilet seat or a handicapped toilet?: No  TIMED UP AND GO:  Was the test performed?  No    Cognitive Function:        07/16/2023    1:33 PM  6CIT Screen  What Year? 0 points  What month? 0 points  What time? 0 points  Count back from 20 0 points  Months in reverse 0 points  Repeat phrase 0 points  Total Score 0 points    Immunizations Immunization History  Administered Date(s) Administered   Fluad Trivalent(High Dose 65+) 07/12/2023   Hepatitis B 05/16/2000, 06/13/2000, 11/14/2000   Influenza Split 07/03/2022   Influenza,inj,Quad PF,6+ Mos 07/14/2020   Influenza-Unspecified 05/27/2018   Moderna Covid-19 Vaccine Bivalent Booster 70yrs & up 08/08/2021   Moderna Sars-Covid-2 Vaccination 08/27/2019, 09/25/2019, 06/29/2020   PNEUMOCOCCAL CONJUGATE-20 07/12/2023   Td 03/10/1998   Tdap 10/23/2010   Zoster Recombinant(Shingrix) 01/26/2019, 03/30/2019    TDAP status: Due, Education has been provided regarding the importance of this vaccine. Advised may receive this vaccine at local pharmacy or Health Dept. Aware to provide a copy of the vaccination record if obtained from local pharmacy or Health Dept. Verbalized acceptance and understanding.  Flu Vaccine status: Up to date  Pneumococcal vaccine status: Up to date  Covid-19 vaccine status: Completed vaccines  Qualifies for Shingles Vaccine? Yes   Zostavax completed No   Shingrix Completed?: Yes  Screening Tests Health Maintenance  Topic Date Due   DTaP/Tdap/Td (3 - Td or Tdap) 10/23/2020   Colonoscopy  01/10/2023   COVID-19 Vaccine (5 - 2023-24 season) 04/28/2023   Medicare Annual Wellness (AWV)  07/15/2024   Pneumonia Vaccine 46+ Years old  Completed  INFLUENZA VACCINE  Completed   Hepatitis C Screening  Completed   Zoster Vaccines- Shingrix  Completed   HPV VACCINES  Aged Out    Health Maintenance  Health Maintenance Due  Topic Date Due    DTaP/Tdap/Td (3 - Td or Tdap) 10/23/2020   Colonoscopy  01/10/2023   COVID-19 Vaccine (5 - 2023-24 season) 04/28/2023    Colorectal cancer screening: Type of screening: Colonoscopy. Completed 01/10/16. Repeat every 7 years- DECLINED REFERRAL  Lung Cancer Screening: (Low Dose CT Chest recommended if Age 14-80 years, 20 pack-year currently smoking OR have quit w/in 15years.) does not qualify.    Additional Screening:  Hepatitis C Screening: does qualify; Completed 11/11/15  Vision Screening: Recommended annual ophthalmology exams for early detection of glaucoma and other disorders of the eye. Is the patient up to date with their annual eye exam?  Yes  Who is the provider or what is the name of the office in which the patient attends annual eye exams? Dr.Woodard If pt is not established with a provider, would they like to be referred to a provider to establish care? No .   Dental Screening: Recommended annual dental exams for proper oral hygiene   Community Resource Referral / Chronic Care Management: CRR required this visit?  No   CCM required this visit?  No     Plan:     I have personally reviewed and noted the following in the patient's chart:   Medical and social history Use of alcohol, tobacco or illicit drugs  Current medications and supplements including opioid prescriptions. Patient is not currently taking opioid prescriptions. Functional ability and status Nutritional status Physical activity Advanced directives List of other physicians Hospitalizations, surgeries, and ER visits in previous 12 months Vitals Screenings to include cognitive, depression, and falls Referrals and appointments  In addition, I have reviewed and discussed with patient certain preventive protocols, quality metrics, and best practice recommendations. A written personalized care plan for preventive services as well as general preventive health recommendations were provided to patient.      Hal Hope, LPN   [id number]   After Visit Summary: (MyChart) Due to this being a telephonic visit, the after visit summary with patients personalized plan was offered to patient via MyChart   Nurse Notes: none

## 2023-07-16 NOTE — Patient Instructions (Addendum)
Dylan Crawford , Thank you for taking time to come for your Medicare Wellness Visit. I appreciate your ongoing commitment to your health goals. Please review the following plan we discussed and let me know if I can assist you in the future.   Referrals/Orders/Follow-Ups/Clinician Recommendations: none  This is a list of the screening recommended for you and due dates:  Health Maintenance  Topic Date Due   DTaP/Tdap/Td vaccine (3 - Td or Tdap) 10/23/2020   Colon Cancer Screening  01/10/2023   COVID-19 Vaccine (5 - 2023-24 season) 04/28/2023   Medicare Annual Wellness Visit  07/15/2024   Pneumonia Vaccine  Completed   Flu Shot  Completed   Hepatitis C Screening  Completed   Zoster (Shingles) Vaccine  Completed   HPV Vaccine  Aged Out    Advanced directives: (ACP Link)Information on Advanced Care Planning can be found at [facility] Advance Health Care Directives Advance Health Care Directives ([email or web address]   Next Medicare Annual Wellness Visit scheduled for next year: Yes    07/21/24 @ 1:50 pm by video

## 2023-08-02 ENCOUNTER — Other Ambulatory Visit: Payer: Self-pay | Admitting: Family Medicine

## 2023-08-02 DIAGNOSIS — E7800 Pure hypercholesterolemia, unspecified: Secondary | ICD-10-CM

## 2023-08-02 DIAGNOSIS — Z8673 Personal history of transient ischemic attack (TIA), and cerebral infarction without residual deficits: Secondary | ICD-10-CM

## 2023-11-07 ENCOUNTER — Other Ambulatory Visit: Payer: Self-pay | Admitting: Family Medicine

## 2023-11-07 DIAGNOSIS — E7800 Pure hypercholesterolemia, unspecified: Secondary | ICD-10-CM

## 2023-11-07 DIAGNOSIS — Z8673 Personal history of transient ischemic attack (TIA), and cerebral infarction without residual deficits: Secondary | ICD-10-CM

## 2023-11-22 ENCOUNTER — Other Ambulatory Visit: Payer: Self-pay | Admitting: Family Medicine

## 2023-11-22 DIAGNOSIS — Z9109 Other allergy status, other than to drugs and biological substances: Secondary | ICD-10-CM

## 2024-02-04 ENCOUNTER — Encounter: Payer: Self-pay | Admitting: Internal Medicine

## 2024-02-05 ENCOUNTER — Ambulatory Visit: Admitting: Anesthesiology

## 2024-02-05 ENCOUNTER — Ambulatory Visit
Admission: RE | Admit: 2024-02-05 | Discharge: 2024-02-05 | Disposition: A | Discharge status: Home / Self Care | Payer: Self-pay | Attending: Internal Medicine | Admitting: Internal Medicine

## 2024-02-05 ENCOUNTER — Encounter
Admission: RE | Disposition: A | Discharge status: Home / Self Care | Payer: Self-pay | Source: Home / Self Care | Attending: Internal Medicine

## 2024-02-05 DIAGNOSIS — Z1211 Encounter for screening for malignant neoplasm of colon: Secondary | ICD-10-CM | POA: Insufficient documentation

## 2024-02-05 DIAGNOSIS — Z8673 Personal history of transient ischemic attack (TIA), and cerebral infarction without residual deficits: Secondary | ICD-10-CM | POA: Diagnosis not present

## 2024-02-05 DIAGNOSIS — G4730 Sleep apnea, unspecified: Secondary | ICD-10-CM | POA: Diagnosis not present

## 2024-02-05 DIAGNOSIS — J45909 Unspecified asthma, uncomplicated: Secondary | ICD-10-CM | POA: Diagnosis not present

## 2024-02-05 DIAGNOSIS — K640 First degree hemorrhoids: Secondary | ICD-10-CM | POA: Insufficient documentation

## 2024-02-05 DIAGNOSIS — K5730 Diverticulosis of large intestine without perforation or abscess without bleeding: Secondary | ICD-10-CM | POA: Diagnosis not present

## 2024-02-05 HISTORY — PX: COLONOSCOPY: SHX5424

## 2024-02-05 SURGERY — COLONOSCOPY
Anesthesia: General

## 2024-02-05 MED ORDER — PROPOFOL 500 MG/50ML IV EMUL
INTRAVENOUS | Status: DC | PRN
  Administered 2024-02-05: 120 ug/kg/min via INTRAVENOUS
  Administered 2024-02-05: 100 mg via INTRAVENOUS

## 2024-02-05 MED ORDER — LIDOCAINE HCL (CARDIAC) PF 100 MG/5ML IV SOSY
PREFILLED_SYRINGE | INTRAVENOUS | Status: DC | PRN
  Administered 2024-02-05: 100 mg via INTRAVENOUS

## 2024-02-05 MED ORDER — SODIUM CHLORIDE 0.9 % IV SOLN
INTRAVENOUS | Status: DC
  Administered 2024-02-05: 20 mL/h via INTRAVENOUS

## 2024-02-05 NOTE — Anesthesia Preprocedure Evaluation (Addendum)
 Anesthesia Evaluation  Patient identified by MRN, date of birth, ID band Patient awake    Reviewed: Allergy & Precautions, NPO status , Patient's Chart, lab work & pertinent test results  History of Anesthesia Complications Negative for: history of anesthetic complications  Airway Mallampati: II   Neck ROM: Full    Dental  (+) Missing   Pulmonary asthma , sleep apnea and Continuous Positive Airway Pressure Ventilation    Pulmonary exam normal breath sounds clear to auscultation       Cardiovascular Exercise Tolerance: Good negative cardio ROS Normal cardiovascular exam Rhythm:Regular Rate:Normal     Neuro/Psych TIA (2019) Neuromuscular disease (neuropathy)    GI/Hepatic negative GI ROS,,,  Endo/Other  Obesity   Renal/GU negative Renal ROS     Musculoskeletal  (+) Arthritis ,    Abdominal   Peds  Hematology negative hematology ROS (+)   Anesthesia Other Findings   Reproductive/Obstetrics                             Anesthesia Physical Anesthesia Plan  ASA: 3  Anesthesia Plan: General   Post-op Pain Management:    Induction: Intravenous  PONV Risk Score and Plan: 2 and Propofol  infusion, TIVA and Treatment may vary due to age or medical condition  Airway Management Planned: Natural Airway  Additional Equipment:   Intra-op Plan:   Post-operative Plan:   Informed Consent: I have reviewed the patients History and Physical, chart, labs and discussed the procedure including the risks, benefits and alternatives for the proposed anesthesia with the patient or authorized representative who has indicated his/her understanding and acceptance.       Plan Discussed with: CRNA  Anesthesia Plan Comments: (LMA/GETA backup discussed.  Patient consented for risks of anesthesia including but not limited to:  - adverse reactions to medications - damage to eyes, teeth, lips or other oral  mucosa - nerve damage due to positioning  - sore throat or hoarseness - damage to heart, brain, nerves, lungs, other parts of body or loss of life  Informed patient about role of CRNA in peri- and intra-operative care.  Patient voiced understanding.)        Anesthesia Quick Evaluation

## 2024-02-05 NOTE — Op Note (Signed)
 [facility]astroenter[facility] Patient Name: Dylan Crawford Procedure Date: 02/05/2024 7:37 AM MRN: [id number] Account #: [id number] Date of Birth: [birth year] Admit Type: Outpatient Age: 68 Room: [facility] ENDO ROOM 2 Gender: Male Note Status: Finalized Instrument Name: Charlyn Cooley [id number] Procedure:             Colonoscopy Indications:           High risk colon cancer surveillance: Personal history                         of non-advanced adenoma Providers:             Amedee Cerrone K. Corky Diener MD, MD Referring MD:          Erlinda Haws. Shann Darnel, MD (Referring MD) Medicines:             Propofol  per Anesthesia Complications:         No immediate complications. Estimated blood loss: None. Procedure:             Pre-Anesthesia Assessment:                        - The risks and benefits of the procedure and the                         sedation options and risks were discussed with the                         patient. All questions were answered and informed                         consent was obtained.                        - Patient identification and proposed procedure were                         verified prior to the procedure by the nurse. The                         procedure was verified in the procedure room.                        - ASA Grade Assessment: III - A patient with severe                         systemic disease.                        - After reviewing the risks and benefits, the patient                         was deemed in satisfactory condition to undergo the                         procedure.                        After obtaining informed consent, the colonoscope was  passed under direct vision. Throughout the procedure,                         the patient's blood pressure, pulse, and oxygen                         saturations were monitored continuously. The                         Colonoscope was introduced through the anus and                          advanced to the the cecum, identified by appendiceal                         orifice and ileocecal valve. The colonoscopy was                         performed without difficulty. The patient tolerated                         the procedure well. The quality of the bowel                         preparation was good. Anatomical landmarks were                         photographed. Findings:      The perianal and digital rectal examinations were normal. Pertinent       negatives include normal sphincter tone and no palpable rectal lesions.      Many small-mouthed diverticula were found in the sigmoid colon.      Non-bleeding internal hemorrhoids were found during retroflexion. The       hemorrhoids were Grade I (internal hemorrhoids that do not prolapse).      The exam was otherwise without abnormality. Impression:            - Diverticulosis in the sigmoid colon.                        - Non-bleeding internal hemorrhoids.                        - The examination was otherwise normal.                        - No specimens collected. Recommendation:        - Patient has a contact number available for                         emergencies. The signs and symptoms of potential                         delayed complications were discussed with the patient.                         Return to normal activities tomorrow. Written                         discharge instructions were  provided to the patient.                        - Resume previous diet.                        - Continue present medications.                        - Repeat colonoscopy in 10 years for screening                         purposes.                        - Return to GI office PRN.                        - The findings and recommendations were discussed with                         the patient. Procedure Code(s):     --- Professional ---                        W0981, Colorectal cancer screening; colonoscopy on                          individual at high risk Diagnosis Code(s):     --- Professional ---                        K57.30, Diverticulosis of large intestine without                         perforation or abscess without bleeding                        K64.0, First degree hemorrhoids                        Z86.010, Personal history of colonic polyps CPT copyright 2022 American Medical Association. All rights reserved. The codes documented in this report are preliminary and upon coder review may  be revised to meet current compliance requirements. Cassie Click MD, MD 02/05/2024 8:43:24 AM This report has been signed electronically. Number of Addenda: 0 Note Initiated On: 02/05/2024 7:37 AM Scope Withdrawal Time: 0 hours 5 minutes 39 seconds  Total Procedure Duration: 0 hours 8 minutes 40 seconds  Estimated Blood Loss:  Estimated blood loss: none.      [facility]

## 2024-02-05 NOTE — Transfer of Care (Signed)
 Immediate Anesthesia Transfer of Care Note  Patient: Dylan Noa Sr.  Procedure(s) Performed: COLONOSCOPY  Patient Location: Endoscopy Unit  Anesthesia Type:General  Level of Consciousness: drowsy  Airway & Oxygen Therapy: Patient Spontanous Breathing  Post-op Assessment: Report given to RN and Post -op Vital signs reviewed and stable  Post vital signs: Reviewed and stable  Last Vitals:  Vitals Value Taken Time  BP 102/71 02/05/24 0844  Temp 35.6 C 02/05/24 0844  Pulse 67 02/05/24 0845  Resp 12 02/05/24 0845  SpO2 96 % 02/05/24 0845  Vitals shown include unfiled device data.  Last Pain:  Vitals:   02/05/24 0844  TempSrc: Temporal  PainSc: 0-No pain         Complications: No notable events documented.

## 2024-02-05 NOTE — Anesthesia Postprocedure Evaluation (Signed)
 Anesthesia Post Note  Patient: Dylan Noa Sr.  Procedure(s) Performed: COLONOSCOPY  Patient location during evaluation: PACU Anesthesia Type: General Level of consciousness: awake and alert, oriented and patient cooperative Pain management: pain level controlled Vital Signs Assessment: post-procedure vital signs reviewed and stable Respiratory status: spontaneous breathing, nonlabored ventilation and respiratory function stable Cardiovascular status: blood pressure returned to baseline and stable Postop Assessment: adequate PO intake Anesthetic complications: no   No notable events documented.   Last Vitals:  Vitals:   02/05/24 0854 02/05/24 0904  BP: 112/87 120/88  Pulse: 71 65  Resp: 19 16  Temp:    SpO2: 97% 98%    Last Pain:  Vitals:   02/05/24 0904  TempSrc:   PainSc: 0-No pain                 Dorothey Gate

## 2024-02-05 NOTE — H&P (Signed)
 Outpatient short stay form Pre-procedure 02/05/2024 8:25 AM Shalicia Craghead K. Corky Diener, M.D.  Primary Physician: Jeralene Mom, M.D.  Reason for visit:  Date of Colon: 01/10/2016 Physician: Dr. Glenis Langdon @ [facility] Polyps Removed: Adenomatous Polyp   History of present illness:                            Patient presents for colonoscopy for a personal hx of colon polyps. The patient denies abdominal pain, abnormal weight loss or rectal bleeding.      Current Facility-Administered Medications:    0.9 %  sodium chloride infusion, , Intravenous, Continuous, [place], Alphonsus Jeans, MD, Last Rate: 20 mL/hr at 02/05/24 0802, 20 mL/hr at 02/05/24 0802  Medications Prior to Admission  Medication Sig Dispense Refill Last Dose/Taking   albuterol  (VENTOLIN  HFA) 108 (90 Base) MCG/ACT inhaler Inhale 2 puffs into the lungs every 6 (six) hours as needed for wheezing or shortness of breath. 1 each 3 Past Week   aspirin EC 81 MG tablet Take 81 mg by mouth daily.   02/04/2024   atorvastatin  (LIPITOR) 80 MG tablet TAKE 1 TABLET BY MOUTH EVERY DAY 90 tablet 4 02/04/2024   Coenzyme Q10 (COQ-10) 200 MG CAPS Take 200 mg by mouth daily.  0 02/04/2024   Diclofenac  Sodium (PENNSAID ) 2 % SOLN Place 1 application onto the skin 2 (two) times daily. 112 g 2 Past Week   EPINEPHrine  0.3 mg/0.3 mL IJ SOAJ injection INJECT 0.3 MLS (0.3 MG TOTAL) INTO THE MUSCLE ONCE FOR 1 DOSE. 2 each 3 Past Week   fexofenadine (ALLEGRA) 180 MG tablet Take 180 mg by mouth every morning.    02/04/2024   montelukast  (SINGULAIR ) 10 MG tablet TAKE 1 TABLET BY MOUTH EVERYDAY AT BEDTIME 90 tablet 4 Past Week     Allergies  Allergen Reactions   Grass Pollen(K-O-R-T-Swt Vern) Shortness Of Breath    Runny, coughing when mowing     Past Medical History:  Diagnosis Date   Allergy    seasonal   Arthritis    Bulging lumbar disc    Colon polyp 2017   Diverticulosis 2017   History of chicken pox    Seasonal allergies    HAS ALBUTEROL  INHALER TO USE PRN    Varicose vein     Review of systems:  Otherwise negative.    Physical Exam  Gen: Alert, oriented. Appears stated age.  HEENT: [place]/AT. PERRLA. Lungs: CTA, no wheezes. CV: RR nl S1, S2. Abd: soft, benign, no masses. BS+ Ext: No edema. Pulses 2+    Planned procedures: Proceed with colonoscopy. The patient understands the nature of the planned procedure, indications, risks, alternatives and potential complications including but not limited to bleeding, infection, perforation, damage to internal organs and possible oversedation/side effects from anesthesia. The patient agrees and gives consent to proceed.  Please refer to procedure notes for findings, recommendations and patient disposition/instructions.     Sema Stangler K. Corky Diener, M.D. Gastroenterology 02/05/2024  8:25 AM

## 2024-02-05 NOTE — Interval H&P Note (Signed)
 History and Physical Interval Note:  02/05/2024 8:26 AM  Dylan Noa Sr.  has presented today for surgery, with the diagnosis of History of colon polyps (Z86.0100).  The various methods of treatment have been discussed with the patient and family. After consideration of risks, benefits and other options for treatment, the patient has consented to  Procedure(s): COLONOSCOPY (N/A) as a surgical intervention.  The patient's history has been reviewed, patient examined, no change in status, stable for surgery.  I have reviewed the patient's chart and labs.  Questions were answered to the patient's satisfaction.     [place], Meleana Commerford

## 2024-02-06 ENCOUNTER — Encounter: Payer: Self-pay | Admitting: Internal Medicine

## 2024-07-13 ENCOUNTER — Encounter: Payer: Self-pay | Admitting: Family Medicine

## 2024-07-17 ENCOUNTER — Encounter: Payer: Self-pay | Admitting: Family Medicine

## 2024-07-17 ENCOUNTER — Ambulatory Visit (INDEPENDENT_AMBULATORY_CARE_PROVIDER_SITE_OTHER): Admitting: Family Medicine

## 2024-07-17 VITALS — BP 126/85 | HR 60 | Ht 73.0 in | Wt 241.2 lb

## 2024-07-17 DIAGNOSIS — Z0000 Encounter for general adult medical examination without abnormal findings: Secondary | ICD-10-CM | POA: Diagnosis not present

## 2024-07-17 DIAGNOSIS — G4733 Obstructive sleep apnea (adult) (pediatric): Secondary | ICD-10-CM

## 2024-07-17 DIAGNOSIS — Z23 Encounter for immunization: Secondary | ICD-10-CM | POA: Diagnosis not present

## 2024-07-17 DIAGNOSIS — Z8673 Personal history of transient ischemic attack (TIA), and cerebral infarction without residual deficits: Secondary | ICD-10-CM | POA: Diagnosis not present

## 2024-07-17 DIAGNOSIS — I679 Cerebrovascular disease, unspecified: Secondary | ICD-10-CM | POA: Diagnosis not present

## 2024-07-17 DIAGNOSIS — Z125 Encounter for screening for malignant neoplasm of prostate: Secondary | ICD-10-CM

## 2024-07-17 NOTE — Patient Instructions (Addendum)
Please review the attached list of medications and notify my office if there are any errors.   You are due for a Tdap (tetanus-diptheria-pertussis vaccine) which protects you from tetanus and whooping cough. Please check with your insurance plan or pharmacy regarding coverage for this vaccine.   

## 2024-07-17 NOTE — Progress Notes (Signed)
 Complete physical exam   Patient: Dylan GROOT Sr.   DOB: 1956-05-22   68 y.o. Male  MRN: [id number] Visit Date: 07/17/2024  Today's healthcare provider: Nancyann Perry, MD   Chief Complaint  Patient presents with   Annual Exam    Pt here for physical , he reports sleeping fairly well , exercising. No concerns today.   Subjective    Discussed the use of AI scribe software for clinical note transcription with the patient, who gave verbal consent to proceed.  History of Present Illness   Dylan HEYER Sr. Dylan Crawford is a 68 year old male who presents for a routine annual physical.  He has no new health problems since his last visit and has not been hospitalized or visited the emergency room recently. He is currently taking atorvastatin  for cholesterol management and a CoQ10 supplement at a dose of 200 mg daily. He inquires about the potential benefits and risks of increasing the CoQ10 dosage.  He experiences muscle and joint aches, which he attributes to arthritis, but remains active. He enjoys taking naps in the afternoon and reports no significant issues with his physical activity level.  He has a history of obstructive sleep apnea and owns a CPAP machine, but he has not been using it recently due to discomfort and difficulties associated with colds. He reports sleeping through the night and waking up early in the morning, with occasional snoring.  No chest pain, heart flutters, or shortness of breath. He also reports no hearing issues or tinnitus, aside from occasional comments from his wife about his hearing.  He experiences occasional constipation depending on his diet but reports no significant stomach problems or changes in bowel habits. He had a colonoscopy earlier this year.  He is not a regular smoker and has received his flu shot during this visit. He is considering getting a COVID booster and a tetanus booster, noting that his last tetanus shot was in 2012, although he  believes he may have received one in 2020 for a work-related injury.     Lab Results  Component Value Date   CHOL 106 07/09/2023   HDL 43 07/09/2023   LDLCALC 50 07/09/2023   TRIG 55 07/09/2023   CHOLHDL 2.5 07/09/2023   Lab Results  Component Value Date   NA 139 07/09/2023   K 4.5 07/09/2023   CREATININE 0.81 07/09/2023   EGFR 97 07/09/2023   GLUCOSE 92 07/09/2023       Past Medical History:  Diagnosis Date   Allergy    seasonal   Arthritis    Bulging lumbar disc    Colon polyp 2017   Diverticulosis 2017   History of chicken pox    Seasonal allergies    HAS ALBUTEROL  INHALER TO USE PRN   Varicose vein    Past Surgical History:  Procedure Laterality Date   COLONOSCOPY  2017   COLONOSCOPY N/A 02/05/2024   Procedure: COLONOSCOPY;  Surgeon: Toledo, Ladell POUR, MD;  Location: [facility];  Service: Gastroenterology;  Laterality: N/A;   HERNIA REPAIR Right 1979   Inguinal hernia repair   UMBILICAL HERNIA REPAIR N/A 09/11/2016   Procedure: HERNIA REPAIR UMBILICAL ADULT with mesh;  Surgeon: Louanne KANDICE Muse, MD;  Location: [facility] ORS;  Service: General;  Laterality: N/A;   Social History   Socioeconomic History   Marital status: Married    Spouse name: Not on file   Number of children: 3   Years of education: Not  on file   Highest education level: Associate degree: occupational, scientist, product/process development, or vocational program  Occupational History   Occupation: Curator  Tobacco Use   Smoking status: Never   Smokeless tobacco: Never  Substance and Sexual Activity   Alcohol use: Yes    Alcohol/week: 0.0 standard drinks of alcohol    Comment: minimal use   Drug use: No   Sexual activity: Yes  Other Topics Concern   Not on file  Social History Narrative   Not on file   Social Drivers of Health   Financial Resource Strain: Low Risk  (07/16/2023)   Overall Financial Resource Strain (CARDIA)    Difficulty of Paying Living Expenses: Not hard at all  Food Insecurity: No  Food Insecurity (07/16/2023)   Hunger Vital Sign    Worried About Running Out of Food in the Last Year: Never true    Ran Out of Food in the Last Year: Never true  Transportation Needs: No Transportation Needs (07/16/2023)   PRAPARE - Administrator, Civil Service (Medical): No    Lack of Transportation (Non-Medical): No  Physical Activity: Sufficiently Active (07/16/2023)   Exercise Vital Sign    Days of Exercise per Week: 3 days    Minutes of Exercise per Session: 50 min  Recent Concern: Physical Activity - Insufficiently Active (07/12/2023)   Exercise Vital Sign    Days of Exercise per Week: 3 days    Minutes of Exercise per Session: 30 min  Stress: No Stress Concern Present (07/16/2023)   [employer] of Occupational Health - Occupational Stress Questionnaire    Feeling of Stress : Not at all  Social Connections: Socially Integrated (07/16/2023)   Social Connection and Isolation Panel    Frequency of Communication with Friends and Family: Three times a week    Frequency of Social Gatherings with Friends and Family: Twice a week    Attends Religious Services: More than 4 times per year    Active Member of [employer] or Organizations: Yes    Attends Engineer, Structural: More than 4 times per year    Marital Status: Married  Catering Manager Violence: Not At Risk (07/16/2023)   Humiliation, Afraid, Rape, and Kick questionnaire    Fear of Current or Ex-Partner: No    Emotionally Abused: No    Physically Abused: No    Sexually Abused: No   Family Status  Relation Name Status   Other gen. family hx Alive   Mother  Deceased       Respiratory problems   Father  Alive   Neg Hx  (Not Specified)  No partnership data on file   Family History  Problem Relation Age of Onset   Stroke Other    Cancer Neg Hx    Heart disease Neg Hx    Allergies  Allergen Reactions   Grass Pollen(K-O-R-T-Swt Vern) Shortness Of Breath    Runny, coughing when mowing     Patient Care Team: Gasper Nancyann BRAVO, MD as PCP - General (Family Medicine) Hosie Lares, MD as Referring Physician (Neurology) Pa, [facility], [facility]   Medications: Outpatient Medications Prior to Visit  Medication Sig   albuterol  (VENTOLIN  HFA) 108 (90 Base) MCG/ACT inhaler Inhale 2 puffs into the lungs every 6 (six) hours as needed for wheezing or shortness of breath.   aspirin EC 81 MG tablet Take 81 mg by mouth daily.   atorvastatin  (LIPITOR) 80 MG tablet TAKE 1 TABLET BY  MOUTH EVERY DAY   Coenzyme Q10 (COQ-10) 200 MG CAPS Take 200 mg by mouth daily.   Diclofenac  Sodium (PENNSAID ) 2 % SOLN Place 1 application onto the skin 2 (two) times daily.   EPINEPHrine  0.3 mg/0.3 mL IJ SOAJ injection INJECT 0.3 MLS (0.3 MG TOTAL) INTO THE MUSCLE ONCE FOR 1 DOSE.   fexofenadine (ALLEGRA) 180 MG tablet Take 180 mg by mouth every morning.    montelukast  (SINGULAIR ) 10 MG tablet TAKE 1 TABLET BY MOUTH EVERYDAY AT BEDTIME   No facility-administered medications prior to visit.    Review of Systems    Objective    BP 126/85 (BP Location: Right Arm, Patient Position: Sitting)   Pulse 60   Ht 6' 1 (1.854 m)   Wt 241 lb 3.2 oz (109.4 kg)   SpO2 98%   BMI 31.82 kg/m    Physical Exam  General Appearance:    Mildly obese male. Alert, cooperative, in no acute distress, appears stated age  Head:    Normocephalic, without obvious abnormality, atraumatic  Eyes:    PERRL, conjunctiva/corneas clear, EOM's intact, fundi    benign, both eyes       Ears:    Normal TM's and external ear canals, both ears  Nose:   Nares normal, septum midline, mucosa normal, no drainage   or sinus tenderness  Throat:   Lips, mucosa, and tongue normal; teeth and gums normal  Neck:   Supple, symmetrical, trachea midline, no adenopathy;       thyroid:  No enlargement/tenderness/nodules; no carotid   bruit or JVD  Back:     Symmetric, no curvature, ROM normal, no CVA  tenderness  Lungs:     Clear to auscultation bilaterally, respirations unlabored  Chest wall:    No tenderness or deformity  Heart:    Normal heart rate. Normal rhythm. No murmurs, rubs, or gallops.  S1 and S2 normal  Abdomen:     Soft, non-tender, bowel sounds active all four quadrants,    no masses, no organomegaly  Genitalia:    deferred  Rectal:    deferred  Extremities:   All extremities are intact. No cyanosis or edema  Pulses:   2+ and symmetric all extremities  Skin:   Skin color, texture, turgor normal, no rashes or lesions  Lymph nodes:   Cervical, supraclavicular, and axillary nodes normal  Neurologic:   CNII-XII intact. Normal strength, sensation and reflexes      throughout       Last depression screening scores    07/17/2024    9:19 AM 07/16/2023    1:30 PM 07/10/2022   10:59 AM  PHQ 2/9 Scores  PHQ - 2 Score 0 0 0  PHQ- 9 Score  0  0      Data saved with a previous flowsheet row definition   Last fall risk screening    07/17/2024    9:18 AM  Fall Risk   Falls in the past year? 0  Number falls in past yr: 0  Injury with Fall? 0  Risk for fall due to : No Fall Risks   Last Audit-C alcohol use screening    07/16/2023    1:30 PM  Alcohol Use Disorder Test (AUDIT)  1. How often do you have a drink containing alcohol? 0  2. How many drinks containing alcohol do you have on a typical day when you are drinking? 0  3. How often do you have six or more drinks on one  occasion? 0  AUDIT-C Score 0   A score of 3 or more in women, and 4 or more in men indicates increased risk for alcohol abuse, EXCEPT if all of the points are from question 1   No results found for any visits on 07/17/24.  Assessment & Plan    Routine Health Maintenance and Physical Exam  Exercise Activities and Dietary recommendations  Goals      DIET - EAT MORE FRUITS AND VEGETABLES        Immunization History  Administered Date(s) Administered   Fluad Trivalent(High Dose 65+)  07/12/2023   Hepatitis B 05/16/2000, 06/13/2000, 11/14/2000   INFLUENZA, HIGH DOSE SEASONAL PF 07/17/2024   Influenza Split 07/03/2022   Influenza,inj,Quad PF,6+ Mos 07/14/2020   Influenza-Unspecified 05/27/2018   Moderna Covid-19 Vaccine Bivalent Booster 70yrs & up 08/08/2021   Moderna Sars-Covid-2 Vaccination 08/27/2019, 09/25/2019, 06/29/2020   PNEUMOCOCCAL CONJUGATE-20 07/12/2023   Td 03/10/1998   Tdap 10/23/2010   Zoster Recombinant(Shingrix ) 01/26/2019, 03/30/2019    Health Maintenance  Topic Date Due   DTaP/Tdap/Td (3 - Td or Tdap) 10/23/2020   COVID-19 Vaccine (5 - 2025-26 season) 04/27/2024   Medicare Annual Wellness (AWV)  07/15/2024   Colonoscopy  02/05/2031   Pneumococcal Vaccine: 50+ Years  Completed   Influenza Vaccine  Completed   Hepatitis C Screening  Completed   Zoster Vaccines- Shingrix   Completed   Meningococcal B Vaccine  Aged Out   Hepatitis B Vaccines 19-59 Average Risk  Discontinued    Discussed health benefits of physical activity, and encouraged him to engage in regular exercise appropriate for his age and condition.     Adult Wellness Visit Routine annual exam with no new issues. Medications well-tolerated. CoQ10 dosage discussed. Tetanus booster status uncertain. - Ordered comprehensive lab work: cholesterol, metabolic panel, blood counts, thyroid function, PSA. - Advised tetanus booster from pharmacy if not received in 2020. - Discussed CoQ10 supplementation, advised increase to 400 mg daily.  Obstructive sleep apnea Non-compliance with CPAP due to discomfort. Adequate sleep reported without CPAP, but snoring persists. - Encouraged resumption of CPAP therapy if feasible.  Cerebrovascular disease and history of TIA/cerebral infarction without residual deficits No recent events or new symptoms. No residual deficits.     Return in about 1 year (around 07/17/2025) for Yearly Physical.        Nancyann Perry, MD  [facility] [phone] (phone) [phone] (fax)  [facility]

## 2024-07-18 LAB — COMPREHENSIVE METABOLIC PANEL WITH GFR
ALT: 27 IU/L (ref 0–44)
AST: 23 IU/L (ref 0–40)
Albumin: 4.5 g/dL (ref 3.9–4.9)
Alkaline Phosphatase: 107 IU/L (ref 47–123)
BUN/Creatinine Ratio: 14 (ref 10–24)
BUN: 12 mg/dL (ref 8–27)
Bilirubin Total: 0.6 mg/dL (ref 0.0–1.2)
CO2: 24 mmol/L (ref 20–29)
Calcium: 9.6 mg/dL (ref 8.6–10.2)
Chloride: 101 mmol/L (ref 96–106)
Creatinine, Ser: 0.86 mg/dL (ref 0.76–1.27)
Globulin, Total: 2.8 g/dL (ref 1.5–4.5)
Glucose: 89 mg/dL (ref 70–99)
Potassium: 5.1 mmol/L (ref 3.5–5.2)
Sodium: 138 mmol/L (ref 134–144)
Total Protein: 7.3 g/dL (ref 6.0–8.5)
eGFR: 95 mL/min/1.73 (ref >59)

## 2024-07-18 LAB — LIPID PANEL
Chol/HDL Ratio: 2.3 ratio (ref 0.0–5.0)
Cholesterol, Total: 89 mg/dL — ABNORMAL LOW (ref 100–199)
HDL: 38 mg/dL — ABNORMAL LOW (ref >39)
LDL Chol Calc (NIH): 37 mg/dL (ref 0–99)
Triglycerides: 64 mg/dL (ref 0–149)
VLDL Cholesterol Cal: 14 mg/dL (ref 5–40)

## 2024-07-18 LAB — CBC
Hematocrit: 47.7 % (ref 37.5–51.0)
Hemoglobin: 15.4 g/dL (ref 13.0–17.7)
MCH: 30.2 pg (ref 26.6–33.0)
MCHC: 32.3 g/dL (ref 31.5–35.7)
MCV: 94 fL (ref 79–97)
Platelets: 244 x10E3/uL (ref 150–450)
RBC: 5.1 x10E6/uL (ref 4.14–5.80)
RDW: 12.6 % (ref 11.6–15.4)
WBC: 6.4 x10E3/uL (ref 3.4–10.8)

## 2024-07-18 LAB — TSH: TSH: 0.995 u[IU]/mL (ref 0.450–4.500)

## 2024-07-18 LAB — PSA TOTAL (REFLEX TO FREE): Prostate Specific Ag, Serum: 0.4 ng/mL (ref 0.0–4.0)

## 2024-07-19 ENCOUNTER — Ambulatory Visit: Payer: Self-pay | Admitting: Family Medicine

## 2025-07-21 ENCOUNTER — Encounter: Admitting: Family Medicine
